# Patient Record
Sex: Female | Born: 1970
Health system: Southern US, Community
[De-identification: ages and names within clinical notes are randomized; demographics above are authoritative.]

## PROBLEM LIST (undated history)

## (undated) DIAGNOSIS — T7840XA Allergy, unspecified, initial encounter: Secondary | ICD-10-CM

## (undated) DIAGNOSIS — E282 Polycystic ovarian syndrome: Secondary | ICD-10-CM

## (undated) DIAGNOSIS — Z8632 Personal history of gestational diabetes: Secondary | ICD-10-CM

## (undated) DIAGNOSIS — R000 Tachycardia, unspecified: Secondary | ICD-10-CM

## (undated) DIAGNOSIS — O139 Gestational [pregnancy-induced] hypertension without significant proteinuria, unspecified trimester: Secondary | ICD-10-CM

## (undated) DIAGNOSIS — F32A Depression, unspecified: Secondary | ICD-10-CM

## (undated) DIAGNOSIS — I10 Essential (primary) hypertension: Secondary | ICD-10-CM

## (undated) DIAGNOSIS — F329 Major depressive disorder, single episode, unspecified: Secondary | ICD-10-CM

## (undated) HISTORY — PX: CARPAL TUNNEL RELEASE: SHX101

## (undated) HISTORY — PX: WISDOM TOOTH EXTRACTION: SHX21

## (undated) HISTORY — DX: Essential (primary) hypertension: I10

## (undated) HISTORY — DX: Allergy, unspecified, initial encounter: T78.40XA

## (undated) HISTORY — DX: Personal history of gestational diabetes: Z86.32

## (undated) HISTORY — PX: OTHER SURGICAL HISTORY: SHX169

## (undated) HISTORY — DX: Major depressive disorder, single episode, unspecified: F32.9

## (undated) HISTORY — DX: Polycystic ovarian syndrome: E28.2

## (undated) HISTORY — DX: Tachycardia, unspecified: R00.0

## (undated) HISTORY — DX: Depression, unspecified: F32.A

---

## 2007-05-11 ENCOUNTER — Encounter (INDEPENDENT_AMBULATORY_CARE_PROVIDER_SITE_OTHER): Payer: Self-pay | Admitting: *Deleted

## 2007-05-11 ENCOUNTER — Ambulatory Visit (HOSPITAL_COMMUNITY): Admission: RE | Admit: 2007-05-11 | Discharge: 2007-05-11 | Payer: Self-pay | Admitting: *Deleted

## 2008-08-05 ENCOUNTER — Ambulatory Visit: Payer: Self-pay | Admitting: Family Medicine

## 2008-08-05 DIAGNOSIS — I10 Essential (primary) hypertension: Secondary | ICD-10-CM | POA: Insufficient documentation

## 2008-08-07 LAB — CONVERTED CEMR LAB
BUN: 11 mg/dL (ref 6–23)
CO2: 28 meq/L (ref 19–32)
Calcium: 9.3 mg/dL (ref 8.4–10.5)
Chloride: 106 meq/L (ref 96–112)
Creatinine, Ser: 0.9 mg/dL (ref 0.4–1.2)
GFR calc non Af Amer: 75.52 mL/min (ref >60)
Glucose, Bld: 98 mg/dL (ref 70–99)
Potassium: 4.2 meq/L (ref 3.5–5.1)
Sodium: 142 meq/L (ref 135–145)
TSH: 1.88 microintl units/mL (ref 0.35–5.50)

## 2008-08-12 ENCOUNTER — Telehealth (INDEPENDENT_AMBULATORY_CARE_PROVIDER_SITE_OTHER): Payer: Self-pay | Admitting: *Deleted

## 2008-10-27 ENCOUNTER — Ambulatory Visit: Payer: Self-pay | Admitting: Family Medicine

## 2008-10-27 DIAGNOSIS — R519 Headache, unspecified: Secondary | ICD-10-CM | POA: Insufficient documentation

## 2008-10-27 DIAGNOSIS — R51 Headache: Secondary | ICD-10-CM | POA: Insufficient documentation

## 2008-10-28 ENCOUNTER — Inpatient Hospital Stay (HOSPITAL_COMMUNITY): Admission: AD | Admit: 2008-10-28 | Discharge: 2008-10-29 | Payer: Self-pay | Admitting: Obstetrics and Gynecology

## 2010-03-16 NOTE — Progress Notes (Signed)
Summary: METHYLDOPA RX CONCERNS//TABORI  Phone Note Call from Patient Call back at Home Phone [phone]   Caller: Patient Summary of Call: PT CALLED  WANTED TO DISCUSS MED METHYLDOPA THAT WAS PRESCRIBED TO HER AT OV. SAYS DISCUSSED TRYING TO GET PREGNANT, AND AFTER READING PAMPHLET SIDE EFFECTS OF THIS MED IS  HARD TO GET PREGNANT,  AND MISSING PERIODS.  AFTER TAKING THIS MED FOR 1 DAY MY  PERIOD STOPPED. Initial call taken by: Kandice Hams,  August 12, 2008 10:28 AM  Follow-up for Phone Call        will switch medication to Labetalol 100mg  two times a day, #60, 1 refill.  should have no interaction w/ periods or ovulation. Follow-up by: Neena Rhymes MD,  August 12, 2008 10:33 AM  Additional Follow-up for Phone Call Additional follow up Details #1::        left message on machine ....................Marland KitchenDoristine Devoid  August 12, 2008 11:17 AM   spoke with patient aware of change in med.................Marland KitchenDoristine Devoid  August 12, 2008 11:29 AM     New/Updated Medications: LABETALOL HCL 100 MG TABS (LABETALOL HCL) take one tablet two times a day   Prescriptions: LABETALOL HCL 100 MG TABS (LABETALOL HCL) take one tablet two times a day  #60 x 1   Entered by:   Doristine Devoid   Authorized by:   Neena Rhymes MD   Signed by:   Doristine Devoid on 08/12/2008   Method used:   [facility]        [facility]  [place] [phone]       [address] [place], [place]  [address]       Ph: [phone] or [phone]       Fax: [phone]   RxID:   [id number]

## 2010-03-16 NOTE — Assessment & Plan Note (Signed)
Summary: NEW/BCBS/NS/KDC   Vital Signs:  Patient profile:   40 year old female Height:      65.75 inches Weight:      234 pounds BMI:     38.19 Pulse rate:   72 / minute BP sitting:   142 / 100  (left arm)  Vitals Entered By: Doristine Devoid (August 05, 2008 8:23 AM) CC: NEW EST- ELEVATED BP   History of Present Illness: 39 yo woman here today at request of Dr Algie Coffer (GYN) for BP.  in the last yr, 3 of 4 BP checks have been elevated.  usually running 130-140/80-90.  strong family hx of HTN, early CAD, and DM.  pt had cholesterol and all lab work was done last month for insurance physical.  no CP, SOB, HAs, visual changes, edema.  has recently lost 30 lbs.  trying to get pregnant- not on birth control.  Preventive Screening-Counseling & Management  Alcohol-Tobacco     Alcohol drinks/day: 0     Smoking Status: never  Caffeine-Diet-Exercise     Does Patient Exercise: yes     Type of exercise: walking     Exercise (avg: min/session): 30-60     Times/week: 7      Sexual History:  currently monogamous.        Drug Use:  never.    Allergies (verified): 1)  ! Codeine  Past History:  Past Medical History: Hypertension  Past Surgical History: Polyp on uterus   Past History:  Care Management: Gynecology: Dr. Ernestina Penna   Family History: CAD-mother MI age 51 HTN-2 brothers DM-mother, 3 siblings STROKE-paternal grandfather COLON CA-no BREAST CA-no  Social History: lives w/ husband, cat works as Agricultural engineer- Engineer, building services Status:  never Does Patient Exercise:  yes Sexual History:  currently monogamous Drug Use:  never  Review of Systems      See HPI  Physical Exam  General:  Well-developed,well-nourished,in no acute distress; alert,appropriate and cooperative throughout examination Head:  Normocephalic and atraumatic without obvious abnormalities. No apparent alopecia or balding. Neck:  No deformities, masses, or tenderness noted. Lungs:  Normal  respiratory effort, chest expands symmetrically. Lungs are clear to auscultation, no crackles or wheezes. Heart:  Normal rate and regular rhythm. S1 and S2 normal without gallop, murmur, click, rub or other extra sounds. Pulses:  +2 carotid, radial, DP Extremities:  No clubbing, cyanosis, edema, or deformity noted with normal full range of motion of all joints.     Impression & Recommendations:  Problem # 1:  HYPERTENSION (ICD-401.9) Assessment New Pt's BP has been consistently elevated recently.  start Aldomet due to desire to get pregnant.  considered HCTZ which is category B in pregnancy if not pregnancy induced HTN, which it's then category D.  due to the large discrepancy in class rankings will not do HCTZ.  will check TSH to r/o underlying cause.  pt aware and in agreement w/ plan. The following medications were removed from the medication list:    Hydrochlorothiazide 12.5 Mg Tabs (Hydrochlorothiazide) .Marland Kitchen... Take 1 tab  by mouth every morning Her updated medication list for this problem includes:    Methyldopa 250 Mg Tabs (Methyldopa) .Marland Kitchen... 1 tab by mouth two times a day  Orders: Venipuncture ([address]) TLB-BMP (Basic Metabolic Panel-BMET) (80048-METABOL) TLB-TSH (Thyroid Stimulating Hormone) (84443-TSH)  Complete Medication List: 1)  Methyldopa 250 Mg Tabs (Methyldopa) .Marland Kitchen.. 1 tab by mouth two times a day  Patient Instructions: 1)  Please schedule a follow-up appointment in 1 month to recheck  BP. 2)  This medication is safe in pregnancy so do not worry if it would happen 3)  We'll notify you of your lab tests 4)  Call with any questions or concerns 5)  Keep up the good work on diet and exercise- you're already doing the most important stuff! 6)  Welcome!  We're glad to have you!  Prescriptions: METHYLDOPA 250 MG  TABS (METHYLDOPA) 1 tab by mouth two times a day  #60 x 3   Entered and Authorized by:   Neena Rhymes MD   Signed by:   Neena Rhymes MD on 08/05/2008   Method  used:   Print then Give to Patient   RxID:   [id number] HYDROCHLOROTHIAZIDE 12.5 MG  TABS (HYDROCHLOROTHIAZIDE) Take 1 tab  by mouth every morning  #30 x 3   Entered and Authorized by:   Neena Rhymes MD   Signed by:   Neena Rhymes MD on 08/05/2008   Method used:   Print then Give to Patient   RxID:   [id number]

## 2010-03-16 NOTE — Assessment & Plan Note (Signed)
Summary: ONE MTH FU/NS/ PT IS HAVING HEADACHES/KDC   Vital Signs:  Patient profile:   40 year old female Weight:      237.13 pounds Pulse rate:   84 / minute BP sitting:   120 / 70  Vitals Entered By: Kandice Hams (October 27, 2008 11:33 AM) CC: C/O HEADACHES F/U ON BP(PT JUST FOUND OUT SHE IS PREGNANT   History of Present Illness: 40 yo woman here today for f/u on BP.  found out last week is pregnant.  has been concerned due to increased HAs- was worried it was BP.  HA arrived on Saturday, still present.  HA encompasses whole head.  GYN- Fogelman aware she is on labetalol.  BP has been well controlled since starting meds.  pt aware that HAs can be hormonal but wants to be sure it's not BP related.  no CP, SOB, visual changes, N/V, focal weakness.    Current Medications (verified): 1)  Labetalol Hcl 100 Mg Tabs (Labetalol Hcl) .... Take One Tablet Two Times A Day  Allergies (verified): 1)  ! Codeine  Past History:  Past Medical History: Last updated: 08/05/2008 Hypertension  Review of Systems      See HPI  Physical Exam  General:  Well-developed,well-nourished,in no acute distress; alert,appropriate and cooperative throughout examination Head:  Normocephalic and atraumatic without obvious abnormalities. No apparent alopecia or balding. Eyes:  No corneal or conjunctival inflammation noted. EOMI. Perrla. Funduscopic exam benign, without hemorrhages, exudates or papilledema. Vision grossly normal. Lungs:  Normal respiratory effort, chest expands symmetrically. Lungs are clear to auscultation, no crackles or wheezes. Heart:  Normal rate and regular rhythm. S1 and S2 normal without gallop, murmur, click, rub or other extra sounds. Neurologic:  No cranial nerve deficits noted. Station and gait are normal. Plantar reflexes are down-going bilaterally. DTRs are symmetrical throughout. Sensory, motor and coordinative functions appear intact.   Impression &  Recommendations:  Problem # 1:  HEADACHE (ICD-784.0) Assessment New pt's BP excellently controlled.  no red flags on hx or PE.  likely due to hormonal changes caused by pregnance.  reviewed supportive care and red flags that should prompt return.  Pt expresses understanding and is in agreement w/ this plan. Her updated medication list for this problem includes:    Labetalol Hcl 100 Mg Tabs (Labetalol hcl) .Marland Kitchen... Take one tablet two times a day  Problem # 2:  HYPERTENSION (ICD-401.9) Assessment: Improved BP well controlled.  no changes at this time. Her updated medication list for this problem includes:    Labetalol Hcl 100 Mg Tabs (Labetalol hcl) .Marland Kitchen... Take one tablet two times a day  Complete Medication List: 1)  Labetalol Hcl 100 Mg Tabs (Labetalol hcl) .... Take one tablet two times a day  Patient Instructions: 1)  Your blood pressure looks great! 2)  I think your headaches are hormonal 3)  CONGRATS on being pregnant!!! Prescriptions: LABETALOL HCL 100 MG TABS (LABETALOL HCL) take one tablet two times a day  #60 x 3   Entered and Authorized by:   Neena Rhymes MD   Signed by:   Neena Rhymes MD on 10/27/2008   Method used:   [facility]        [phone]retail[facility]       [address]       [place], [place]  [address]       Ph: [phone] or [phone]       Fax: [phone]   RxID:   [id number]

## 2010-04-07 ENCOUNTER — Ambulatory Visit (INDEPENDENT_AMBULATORY_CARE_PROVIDER_SITE_OTHER): Payer: BC Managed Care – PPO | Admitting: Family Medicine

## 2010-04-07 ENCOUNTER — Encounter: Payer: Self-pay | Admitting: Family Medicine

## 2010-04-07 DIAGNOSIS — L02219 Cutaneous abscess of trunk, unspecified: Secondary | ICD-10-CM

## 2010-04-07 DIAGNOSIS — L03319 Cellulitis of trunk, unspecified: Secondary | ICD-10-CM | POA: Insufficient documentation

## 2010-04-13 NOTE — Assessment & Plan Note (Signed)
Summary: ER FOLLOW UP--PH   Vital Signs:  Patient profile:   40 year old female Weight:      246 pounds BMI:     40.15 Pulse rate:   82 / minute BP sitting:   130 / 84  (left arm)  Vitals Entered By: Doristine Devoid CMA (April 07, 2010 2:59 PM) CC: ER f/u for cellulitis given doxy w/ nausea only has 2 days left of med   History of Present Illness: 40 yo woman here today for ER f/u.  dx'd w/ cellulitis of trunk under R arm.  last week had 4 hr car ride and bra was irritating area.  'it felt like a tooth ache'.  went to ER b/c sister told her it didn't look good'.  area rapidly enlarged, 'like an oblong knot'.  was given demerol- made her sick.  started on Doxy- making pt very nauseous.  has 2 days remaining- has already taken for 8 days.  area is much improved per pt report and no longer painful.  Current Medications (verified): 1)  Labetalol Hcl 100 Mg Tabs (Labetalol Hcl) .... Take One Tablet Two Times A Day 2)  Cephalexin 500 Mg  Tabs (Cephalexin) .... Take One By Mouth 2 Times Daily.  Take W/ Food.  Allergies (verified): 1)  ! Codeine  Review of Systems      See HPI  Physical Exam  General:  Well-developed,well-nourished,in no acute distress; alert,appropriate and cooperative throughout examination Skin:  3 cm area along R middle ribs in midaxillary line w/ induration, no erythema, no drainage, no TTP Cervical Nodes:  No lymphadenopathy noted Axillary Nodes:  No palpable lymphadenopathy   Impression & Recommendations:  Problem # 1:  CELLULITIS/ABSCESS, TRUNK (ICD-682.2) Assessment New area healing well, no drainage, no erythema.  remaining induration.  given nausea will stop Doxy.  script given for Keflex should area again worsen.  reviewed supportive care and red flags that should prompt return.  Pt expresses understanding and is in agreement w/ this plan. Her updated medication list for this problem includes:    Cephalexin 500 Mg Tabs (Cephalexin) .Marland Kitchen... Take one by  mouth 2 times daily.  take w/ food.  Complete Medication List: 1)  Labetalol Hcl 100 Mg Tabs (Labetalol hcl) .... Take one tablet two times a day 2)  Cephalexin 500 Mg Tabs (Cephalexin) .... Take one by mouth 2 times daily.  take w/ food.  Patient Instructions: 1)  STOP the Doxycycline 2)  If the area has worsening redness, increased swelling or pain, or any other concerns- start the Cephalexin (take w/ food) 3)  Apply hot compresses to the area to encourage drainage 4)  Call with any questions or concerns 5)  Hang in there!!! Prescriptions: CEPHALEXIN 500 MG  TABS (CEPHALEXIN) take one by mouth 2 times daily.  take w/ food.  #14 x 0   Entered and Authorized by:   Neena Rhymes MD   Signed by:   Neena Rhymes MD on 04/07/2010   Method used:   Print then Give to Patient   RxID:   [id number]    Orders Added: 1)  Est. Patient Level III [38756]

## 2010-05-21 LAB — CBC
HCT: 32.5 % — ABNORMAL LOW (ref 36.0–46.0)
HCT: 35.3 % — ABNORMAL LOW (ref 36.0–46.0)
Hemoglobin: 10.7 g/dL — ABNORMAL LOW (ref 12.0–15.0)
Hemoglobin: 11.8 g/dL — ABNORMAL LOW (ref 12.0–15.0)
MCHC: 32.8 g/dL (ref 30.0–36.0)
MCHC: 33.3 g/dL (ref 30.0–36.0)
MCV: 84.3 fL (ref 78.0–100.0)
MCV: 85.7 fL (ref 78.0–100.0)
Platelets: 295 10*3/uL (ref 150–400)
Platelets: 351 10*3/uL (ref 150–400)
RBC: 3.8 MIL/uL — ABNORMAL LOW (ref 3.87–5.11)
RBC: 4.19 MIL/uL (ref 3.87–5.11)
RDW: 16.2 % — ABNORMAL HIGH (ref 11.5–15.5)
RDW: 16.5 % — ABNORMAL HIGH (ref 11.5–15.5)
WBC: 7.2 10*3/uL (ref 4.0–10.5)
WBC: 8 10*3/uL (ref 4.0–10.5)

## 2010-05-21 LAB — HCG, QUANTITATIVE, PREGNANCY
hCG, Beta Chain, Quant, S: 562 m[IU]/mL — ABNORMAL HIGH (ref <5)
hCG, Beta Chain, Quant, S: 745 m[IU]/mL — ABNORMAL HIGH (ref <5)

## 2010-06-29 NOTE — Op Note (Signed)
NAME:  Bowring, Flossie                  ACCOUNT NO.:  [id number]   MEDICAL RECORD NO.:  [id number]          PATIENT TYPE:  AMB   LOCATION:  SDC                           FACILITY:  [facility]   PHYSICIAN:  [place] B. Earlene Plater, M.D.  DATE OF BIRTH:  [birth year]   DATE OF PROCEDURE:  05/11/2007  DATE OF DISCHARGE:                               OPERATIVE REPORT   PREOPERATIVE DIAGNOSIS:  Abnormal bleeding, endometrial polyp.   POSTOPERATIVE DIAGNOSIS:  Abnormal bleeding, endometrial polyp.   PROCEDURE:  Hysteroscopy with D and C and polyp removal.   SURGEON:  [place] B. Earlene Plater, M.D.   ANESTHESIA:  Is MAC and 20 mL of 1% Nesacaine.   FINDINGS:  Two endometrial polyps, one at the right fundal area, one at  the level of the lower uterine segment.   SPECIMENS:  Endometrial polyps and endometrial curettings to pathology.   BLOOD LOSS:  Minimal.   COMPLICATIONS:  None.   FLUID DEFICIT:  Fifty mL.   INDICATIONS FOR PROCEDURE:  A patient with above history.  Saline  infusion ultrasound suggestive of endometrial mass.  The patient advised  the risks of surgery including infection, bleeding, uterine perforation,  damage to surrounding organs.   PROCEDURE IN DETAIL:  The patient was taken to the operating room and  MAC anesthesia obtained.  Prepped and draped in standard fashion.  Bladder drained with in-and-out cath.  Exam under anesthesia showed an  anteverted normal size uterus, no adnexal masses.   Speculum inserted.  Paracervical block placed.  Cervix dilated to #21.  The diagnostic hysteroscope was inserted.  Two polyps were seen as  outlined above.  Removed with Randall stone forceps and the endometrium  gently  curetted.  The cavity reinspected.  No additional findings noted.  Therefore the procedure was terminated.  The instruments were removed.  Cervix hemostatic.  The patient tolerated the procedure without  complication.  The patient was taken to the recovery room awake and in  stable  condition.      Gerri Spore B. Earlene Plater, M.D.  Electronically Signed     WBD/MEDQ  D:  05/11/2007  T:  05/12/2007  Job:  [id number]

## 2010-11-08 LAB — DIFFERENTIAL
Basophils Absolute: 0
Basophils Relative: 0
Eosinophils Absolute: 0.1
Eosinophils Relative: 1
Lymphocytes Relative: 31
Lymphs Abs: 2.1
Monocytes Absolute: 0.5
Monocytes Relative: 7
Neutro Abs: 4.2
Neutrophils Relative %: 61

## 2010-11-08 LAB — CBC
HCT: 41.9
Hemoglobin: 14
MCHC: 33.3
MCV: 82.1
Platelets: 424 — ABNORMAL HIGH
RBC: 5.11
RDW: 15.7 — ABNORMAL HIGH
WBC: 6.9

## 2010-11-08 LAB — PREGNANCY, URINE: Preg Test, Ur: NEGATIVE

## 2012-04-03 ENCOUNTER — Other Ambulatory Visit (HOSPITAL_COMMUNITY): Payer: Self-pay

## 2012-04-03 ENCOUNTER — Other Ambulatory Visit (HOSPITAL_COMMUNITY): Payer: Self-pay | Admitting: Obstetrics

## 2012-04-03 DIAGNOSIS — N979 Female infertility, unspecified: Secondary | ICD-10-CM

## 2012-04-10 ENCOUNTER — Ambulatory Visit (HOSPITAL_COMMUNITY)
Admission: RE | Admit: 2012-04-10 | Discharge: 2012-04-10 | Disposition: A | Discharge status: Home / Self Care | Payer: 59 | Source: Ambulatory Visit | Attending: Obstetrics | Admitting: Obstetrics

## 2012-04-10 DIAGNOSIS — N979 Female infertility, unspecified: Secondary | ICD-10-CM | POA: Insufficient documentation

## 2012-04-10 MED ORDER — IOHEXOL 300 MG/ML  SOLN
20.0000 mL | Freq: Once | INTRAMUSCULAR | Status: AC | PRN
  Administered 2012-04-10: 3 mL

## 2012-07-18 ENCOUNTER — Encounter: Payer: Self-pay | Admitting: Family Medicine

## 2012-07-18 ENCOUNTER — Ambulatory Visit (INDEPENDENT_AMBULATORY_CARE_PROVIDER_SITE_OTHER): Payer: 59 | Admitting: Family Medicine

## 2012-07-18 VITALS — BP 130/90 | HR 104 | Temp 99.3°F | Ht 66.0 in | Wt 252.0 lb

## 2012-07-18 DIAGNOSIS — I10 Essential (primary) hypertension: Secondary | ICD-10-CM

## 2012-07-18 MED ORDER — LABETALOL HCL 100 MG PO TABS: 100.0000 mg | ORAL_TABLET | Freq: Two times a day (BID) | ORAL | Status: DC

## 2012-07-18 NOTE — Progress Notes (Signed)
  Subjective:    Patient ID: Carol Houston, female    DOB: 03-Jun-1970, 42 y.o.   MRN: [id number]  HPI Elevated BP- pt has hx of similar.  Today's reading is the highest it's been.  Typically running 130s/80s.  No HAs, visual changes, CP, SOB, edema.  Previously on Labetalol w/ good results and no side effects.   Review of Systems For ROS see HPI     Objective:   Physical Exam  Vitals reviewed. Constitutional: She is oriented to person, place, and time. She appears well-developed and well-nourished. No distress.  HENT:  Head: Normocephalic and atraumatic.  Eyes: Conjunctivae and EOM are normal. Pupils are equal, round, and reactive to light.  Neck: Normal range of motion. Neck supple. No thyromegaly present.  Cardiovascular: Normal rate, regular rhythm, normal heart sounds and intact distal pulses.   No murmur heard. Pulmonary/Chest: Effort normal and breath sounds normal. No respiratory distress.  Abdominal: Soft. She exhibits no distension. There is no tenderness.  Musculoskeletal: She exhibits no edema.  Lymphadenopathy:    She has no cervical adenopathy.  Neurological: She is alert and oriented to person, place, and time.  Skin: Skin is warm and dry.  Psychiatric: She has a normal mood and affect. Her behavior is normal.          Assessment & Plan:

## 2012-07-18 NOTE — Patient Instructions (Addendum)
Follow up in 1 month to recheck BP Start the low dose labetalol twice daily Keep up the good work on weight loss efforts- you can do this! Call with any questions or concerns Hang in there!!!

## 2012-07-18 NOTE — Assessment & Plan Note (Signed)
Recurrent problem.  Directed related to pt's weight gain.  Discussed that pt is in grey area- elevated but not dramatically so.  Pt prefers to restart meds as she attempts to lose weight.  Will do Labetalol since she is attempting to get pregnant.  Pt expressed understanding and is in agreement w/ plan.

## 2012-11-08 ENCOUNTER — Ambulatory Visit (INDEPENDENT_AMBULATORY_CARE_PROVIDER_SITE_OTHER): Payer: BC Managed Care – PPO | Admitting: Family Medicine

## 2012-11-08 ENCOUNTER — Encounter: Payer: Self-pay | Admitting: Family Medicine

## 2012-11-08 VITALS — BP 124/82 | HR 108 | Temp 99.1°F | Wt 261.2 lb

## 2012-11-08 DIAGNOSIS — M25532 Pain in left wrist: Secondary | ICD-10-CM

## 2012-11-08 DIAGNOSIS — J069 Acute upper respiratory infection, unspecified: Secondary | ICD-10-CM

## 2012-11-08 DIAGNOSIS — I10 Essential (primary) hypertension: Secondary | ICD-10-CM

## 2012-11-08 DIAGNOSIS — M25539 Pain in unspecified wrist: Secondary | ICD-10-CM | POA: Insufficient documentation

## 2012-11-08 MED ORDER — NAPROXEN 500 MG PO TABS: 500.0000 mg | ORAL_TABLET | Freq: Two times a day (BID) | ORAL | Status: AC

## 2012-11-08 MED ORDER — PROMETHAZINE-DM 6.25-15 MG/5ML PO SYRP: 5.0000 mL | ORAL_SOLUTION | Freq: Four times a day (QID) | ORAL | Status: DC | PRN

## 2012-11-08 NOTE — Patient Instructions (Addendum)
Start the Naproxen twice daily- take w/ food- for pain and inflammation Wear the wrist brace for comfort ICE! Use the cough syrup as needed Drink plenty of fluids REST! Stop the Labetalol once feeling better- monitor your BP x2 weeks.  If consistently higher than 140/90, restart meds and call to report readings Call with any questions or concerns Hang in there!

## 2012-11-08 NOTE — Progress Notes (Signed)
  Subjective:    Patient ID: Carol Houston, female    DOB: 07/26/1970, 42 y.o.   MRN: [id number]  HPI Wrist pain- L, 'can't make a fist, hurts to pull my pants up'.  sxs started ~1 month ago.  Pain started in wrist and travelled into pinky.  Started tylenol arthritis w/out relief.  Now all fingers hurt to bend and pain is radiating up into elbow.  Does a lot of typing.  R hand dominant.  URI- sxs started Tuesday night w/ sore throat.  Now w/ nasal congestion, facial pain/pressure, HA.  No nausea.  Mild cough, + PND.  No ear pain.  Low grade fever.  + sick contacts.   Review of Systems For ROS see HPI     Objective:   Physical Exam  Vitals reviewed. Constitutional: She appears well-developed and well-nourished. No distress.  HENT:  Head: Normocephalic and atraumatic.  Right Ear: Tympanic membrane normal.  Left Ear: Tympanic membrane normal.  Nose: Mucosal edema and rhinorrhea present. Right sinus exhibits no maxillary sinus tenderness and no frontal sinus tenderness. Left sinus exhibits no maxillary sinus tenderness and no frontal sinus tenderness.  Mouth/Throat: Mucous membranes are normal. Posterior oropharyngeal erythema (w/ PND) present.  Eyes: Conjunctivae and EOM are normal. Pupils are equal, round, and reactive to light.  Neck: Normal range of motion. Neck supple.  Cardiovascular: Normal rate, regular rhythm and normal heart sounds.   Pulmonary/Chest: Effort normal and breath sounds normal. No respiratory distress. She has no wheezes. She has no rales.  Musculoskeletal:       Left wrist: She exhibits decreased range of motion (pain w/ extension, ulnar deviation), tenderness (over ulnar styloid) and swelling (diffusely of L hand and wrist). She exhibits no effusion and no deformity.  Lymphadenopathy:    She has no cervical adenopathy.          Assessment & Plan:

## 2012-11-11 NOTE — Assessment & Plan Note (Signed)
New.  Pain located on ulnar aspect of wrist.  Start scheduled NSAIDs.  Wear wrist brace to limit motion and allow healing of suspected tendonitis.  If no improvement, will need referral to ortho.  Reviewed supportive care and red flags that should prompt return.  Pt expressed understanding and is in agreement w/ plan.

## 2012-11-11 NOTE — Assessment & Plan Note (Signed)
New.  No evidence of bacterial infxn.  Cough syrup PRN.  Reviewed supportive care and red flags that should prompt return.  Pt expressed understanding and is in agreement w/ plan.

## 2012-11-11 NOTE — Assessment & Plan Note (Signed)
Improved.  Pt would like to stop Labetalol and see if BP remains controlled.  Reviewed how to stop meds, the importance of checking home BPs and calling in results.  Pt expressed understanding and is in agreement w/ plan.

## 2012-11-22 ENCOUNTER — Telehealth: Payer: Self-pay | Admitting: Family Medicine

## 2012-11-22 DIAGNOSIS — M25532 Pain in left wrist: Secondary | ICD-10-CM

## 2012-11-22 NOTE — Telephone Encounter (Signed)
Referral placed. Pt notified.  

## 2012-11-22 NOTE — Telephone Encounter (Signed)
Patient called to let dr Beverely Low know that her left hand and wrist is still not doing any better. Thanks

## 2014-05-19 LAB — OB RESULTS CONSOLE HEPATITIS B SURFACE ANTIGEN: Hepatitis B Surface Ag: NEGATIVE

## 2014-05-19 LAB — OB RESULTS CONSOLE HIV ANTIBODY (ROUTINE TESTING): HIV: NONREACTIVE

## 2014-05-19 LAB — OB RESULTS CONSOLE ABO/RH: RH Type: POSITIVE

## 2014-05-19 LAB — OB RESULTS CONSOLE RUBELLA ANTIBODY, IGM: Rubella: IMMUNE

## 2014-05-19 LAB — OB RESULTS CONSOLE ANTIBODY SCREEN: Antibody Screen: NEGATIVE

## 2014-05-19 LAB — OB RESULTS CONSOLE RPR: RPR: NONREACTIVE

## 2014-05-26 LAB — OB RESULTS CONSOLE GC/CHLAMYDIA
Chlamydia: NEGATIVE
Gonorrhea: NEGATIVE

## 2014-06-18 ENCOUNTER — Encounter: Payer: BLUE CROSS/BLUE SHIELD | Attending: Obstetrics

## 2014-06-18 VITALS — Ht 66.0 in | Wt 255.4 lb

## 2014-06-18 DIAGNOSIS — Z713 Dietary counseling and surveillance: Secondary | ICD-10-CM | POA: Diagnosis not present

## 2014-06-18 DIAGNOSIS — O24419 Gestational diabetes mellitus in pregnancy, unspecified control: Secondary | ICD-10-CM

## 2014-06-19 NOTE — Progress Notes (Signed)
  Patient was seen on 06/18/14 for Gestational Diabetes self-management . The following learning objectives were met by the patient :   States the definition of Gestational Diabetes  States why dietary management is important in controlling blood glucose  Describes the effects of carbohydrates on blood glucose levels  Demonstrates ability to create a balanced meal plan  Demonstrates carbohydrate counting   States when to check blood glucose levels  Demonstrates proper blood glucose monitoring techniques  States the effect of stress and exercise on blood glucose levels  States the importance of limiting caffeine and abstaining from alcohol and smoking  Plan:  Aim for 2 Carb Choices per meal (30 grams) +/- 1 either way for breakfast Aim for 3 Carb Choices per meal (45 grams) +/- 1 either way from lunch and dinner Aim for 1-2 Carbs per snack Begin reading food labels for Total Carbohydrate and sugar grams of foods Consider  increasing your activity level by walking daily as tolerated Begin checking BG before breakfast and 2 hours after first bit of breakfast, lunch and dinner after  as directed by MD  Take medication  as directed by MD  Blood glucose monitor given: One Touch Ultra Mini Lot # [id number] X Exp: 11/04/14 Blood glucose reading: 116m/dl  Patient instructed to monitor glucose levels: FBS: 60 - <90 2 hour: <120  Patient received the following handouts:  Nutrition Diabetes and Pregnancy  Carbohydrate Counting List  Meal Planning worksheet  Patient will be seen for follow-up as needed.

## 2014-11-10 LAB — OB RESULTS CONSOLE GBS: GBS: POSITIVE

## 2014-11-21 ENCOUNTER — Other Ambulatory Visit: Payer: Self-pay | Admitting: Obstetrics

## 2014-11-25 ENCOUNTER — Encounter (HOSPITAL_COMMUNITY): Payer: Self-pay | Admitting: *Deleted

## 2014-11-25 ENCOUNTER — Telehealth (HOSPITAL_COMMUNITY): Payer: Self-pay | Admitting: *Deleted

## 2014-11-25 NOTE — Telephone Encounter (Signed)
Preadmission screen  

## 2014-11-26 ENCOUNTER — Encounter (HOSPITAL_COMMUNITY): Payer: Self-pay | Admitting: *Deleted

## 2014-11-27 ENCOUNTER — Inpatient Hospital Stay (HOSPITAL_COMMUNITY): Payer: BLUE CROSS/BLUE SHIELD | Admitting: Anesthesiology

## 2014-11-27 ENCOUNTER — Inpatient Hospital Stay (HOSPITAL_COMMUNITY)
Admission: RE | Admit: 2014-11-27 | Discharge: 2014-12-01 | DRG: 765 | Disposition: A | Discharge status: Home / Self Care | Payer: BLUE CROSS/BLUE SHIELD | Source: Ambulatory Visit | Attending: Obstetrics | Admitting: Obstetrics

## 2014-11-27 ENCOUNTER — Encounter (HOSPITAL_COMMUNITY): Payer: Self-pay

## 2014-11-27 DIAGNOSIS — O24415 Gestational diabetes mellitus in pregnancy, controlled by oral hypoglycemic drugs: Secondary | ICD-10-CM | POA: Diagnosis present

## 2014-11-27 DIAGNOSIS — O324XX0 Maternal care for high head at term, not applicable or unspecified: Principal | ICD-10-CM | POA: Diagnosis present

## 2014-11-27 DIAGNOSIS — O10919 Unspecified pre-existing hypertension complicating pregnancy, unspecified trimester: Secondary | ICD-10-CM | POA: Diagnosis present

## 2014-11-27 DIAGNOSIS — Z6841 Body Mass Index (BMI) 40.0 and over, adult: Secondary | ICD-10-CM

## 2014-11-27 DIAGNOSIS — O99824 Streptococcus B carrier state complicating childbirth: Secondary | ICD-10-CM | POA: Diagnosis present

## 2014-11-27 DIAGNOSIS — O409XX0 Polyhydramnios, unspecified trimester, not applicable or unspecified: Secondary | ICD-10-CM | POA: Diagnosis present

## 2014-11-27 DIAGNOSIS — Z3A38 38 weeks gestation of pregnancy: Secondary | ICD-10-CM | POA: Diagnosis not present

## 2014-11-27 DIAGNOSIS — O403XX0 Polyhydramnios, third trimester, not applicable or unspecified: Secondary | ICD-10-CM | POA: Diagnosis present

## 2014-11-27 DIAGNOSIS — O99214 Obesity complicating childbirth: Secondary | ICD-10-CM | POA: Diagnosis present

## 2014-11-27 DIAGNOSIS — O1092 Unspecified pre-existing hypertension complicating childbirth: Secondary | ICD-10-CM | POA: Diagnosis present

## 2014-11-27 DIAGNOSIS — O10913 Unspecified pre-existing hypertension complicating pregnancy, third trimester: Secondary | ICD-10-CM | POA: Diagnosis present

## 2014-11-27 HISTORY — DX: Gestational (pregnancy-induced) hypertension without significant proteinuria, unspecified trimester: O13.9

## 2014-11-27 LAB — COMPREHENSIVE METABOLIC PANEL
ALT: 18 U/L (ref 14–54)
AST: 18 U/L (ref 15–41)
Albumin: 2.8 g/dL — ABNORMAL LOW (ref 3.5–5.0)
Alkaline Phosphatase: 176 U/L — ABNORMAL HIGH (ref 38–126)
Anion gap: 7 (ref 5–15)
BUN: 16 mg/dL (ref 6–20)
CO2: 17 mmol/L — ABNORMAL LOW (ref 22–32)
Calcium: 8.8 mg/dL — ABNORMAL LOW (ref 8.9–10.3)
Chloride: 109 mmol/L (ref 101–111)
Creatinine, Ser: 0.75 mg/dL (ref 0.44–1.00)
GFR calc Af Amer: >60 mL/min (ref >60)
GFR calc non Af Amer: >60 mL/min (ref >60)
Glucose, Bld: 96 mg/dL (ref 65–99)
Potassium: 3.6 mmol/L (ref 3.5–5.1)
Sodium: 133 mmol/L — ABNORMAL LOW (ref 135–145)
Total Bilirubin: 0.6 mg/dL (ref 0.3–1.2)
Total Protein: 6.5 g/dL (ref 6.5–8.1)

## 2014-11-27 LAB — CBC
HCT: 36.6 % (ref 36.0–46.0)
HCT: 36.6 % (ref 36.0–46.0)
Hemoglobin: 12 g/dL (ref 12.0–15.0)
Hemoglobin: 11.9 g/dL — ABNORMAL LOW (ref 12.0–15.0)
MCH: 29.9 pg (ref 26.0–34.0)
MCH: 29.6 pg (ref 26.0–34.0)
MCHC: 32.8 g/dL (ref 30.0–36.0)
MCHC: 32.5 g/dL (ref 30.0–36.0)
MCV: 91.3 fL (ref 78.0–100.0)
MCV: 91 fL (ref 78.0–100.0)
Platelets: 232 10*3/uL (ref 150–400)
Platelets: 236 10*3/uL (ref 150–400)
RBC: 4.01 MIL/uL (ref 3.87–5.11)
RBC: 4.02 MIL/uL (ref 3.87–5.11)
RDW: 15 % (ref 11.5–15.5)
RDW: 15.1 % (ref 11.5–15.5)
WBC: 6.9 10*3/uL (ref 4.0–10.5)
WBC: 9.2 10*3/uL (ref 4.0–10.5)

## 2014-11-27 LAB — TYPE AND SCREEN
ABO/RH(D): O POS
Antibody Screen: NEGATIVE

## 2014-11-27 LAB — GLUCOSE, CAPILLARY: Glucose-Capillary: 89 mg/dL (ref 65–99)

## 2014-11-27 LAB — URIC ACID: Uric Acid, Serum: 6.8 mg/dL — ABNORMAL HIGH (ref 2.3–6.6)

## 2014-11-27 LAB — ABO/RH: ABO/RH(D): O POS

## 2014-11-27 MED ORDER — LIDOCAINE HCL (PF) 1 % IJ SOLN
INTRAMUSCULAR | Status: DC | PRN
  Administered 2014-11-27 (×2): 4 mL

## 2014-11-27 MED ORDER — LACTATED RINGERS IV SOLN
500.0000 mL | INTRAVENOUS | Status: DC | PRN
  Administered 2014-11-27: 500 mL via INTRAVENOUS

## 2014-11-27 MED ORDER — OXYTOCIN BOLUS FROM INFUSION: 500.0000 mL | INTRAVENOUS | Status: DC

## 2014-11-27 MED ORDER — LABETALOL HCL 5 MG/ML IV SOLN
20.0000 mg | INTRAVENOUS | Status: DC | PRN
  Administered 2014-11-27: 20 mg via INTRAVENOUS

## 2014-11-27 MED ORDER — EPHEDRINE 5 MG/ML INJ: 10.0000 mg | INTRAVENOUS | Status: DC | PRN

## 2014-11-27 MED ORDER — FENTANYL 2.5 MCG/ML BUPIVACAINE 1/10 % EPIDURAL INFUSION (WH - ANES)
14.0000 mL/h | INTRAMUSCULAR | Status: DC | PRN
  Administered 2014-11-27 – 2014-11-28 (×2): 14 mL/h via EPIDURAL
  Filled 2014-11-27 (×2): qty 125

## 2014-11-27 MED ORDER — SODIUM BICARBONATE 8.4 % IV SOLN
INTRAVENOUS | Status: DC | PRN
  Administered 2014-11-27: 5 mL via EPIDURAL
  Administered 2014-11-28 (×2): 3 mL via EPIDURAL
  Administered 2014-11-28: 5 mL via EPIDURAL

## 2014-11-27 MED ORDER — FENTANYL CITRATE (PF) 100 MCG/2ML IJ SOLN
100.0000 ug | INTRAMUSCULAR | Status: DC | PRN
  Administered 2014-11-27: 100 ug via INTRAVENOUS
  Filled 2014-11-27: qty 2

## 2014-11-27 MED ORDER — LACTATED RINGERS IV SOLN
INTRAVENOUS | Status: DC
  Administered 2014-11-27 – 2014-11-28 (×2): via INTRAVENOUS

## 2014-11-27 MED ORDER — DIPHENHYDRAMINE HCL 50 MG/ML IJ SOLN: 12.5000 mg | INTRAMUSCULAR | Status: DC | PRN

## 2014-11-27 MED ORDER — ONDANSETRON HCL 4 MG/2ML IJ SOLN
4.0000 mg | Freq: Four times a day (QID) | INTRAMUSCULAR | Status: DC | PRN
Start: 2014-11-27 — End: 2014-11-28
  Administered 2014-11-27: 4 mg via INTRAVENOUS
  Filled 2014-11-27: qty 2

## 2014-11-27 MED ORDER — MISOPROSTOL 25 MCG QUARTER TABLET: 25.0000 ug | ORAL_TABLET | ORAL | Status: DC | PRN

## 2014-11-27 MED ORDER — PENICILLIN G POTASSIUM 5000000 UNITS IJ SOLR
5.0000 10*6.[IU] | Freq: Once | INTRAVENOUS | Status: AC
  Administered 2014-11-27: 5 10*6.[IU] via INTRAVENOUS
  Filled 2014-11-27: qty 5

## 2014-11-27 MED ORDER — ACETAMINOPHEN 325 MG PO TABS
650.0000 mg | ORAL_TABLET | ORAL | Status: DC | PRN
  Administered 2014-11-27 – 2014-11-28 (×2): 650 mg via ORAL
  Filled 2014-11-27 (×2): qty 2

## 2014-11-27 MED ORDER — HYDRALAZINE HCL 20 MG/ML IJ SOLN: 10.0000 mg | Freq: Once | INTRAMUSCULAR | Status: DC | PRN

## 2014-11-27 MED ORDER — OXYTOCIN 40 UNITS IN LACTATED RINGERS INFUSION - SIMPLE MED
1.0000 m[IU]/min | INTRAVENOUS | Status: DC
  Administered 2014-11-27: 2 m[IU]/min via INTRAVENOUS
  Filled 2014-11-27: qty 1000

## 2014-11-27 MED ORDER — LACTATED RINGERS IV SOLN
INTRAVENOUS | Status: DC
  Administered 2014-11-27 – 2014-11-28 (×4): via INTRAVENOUS

## 2014-11-27 MED ORDER — PENICILLIN G POTASSIUM 5000000 UNITS IJ SOLR
2.5000 10*6.[IU] | INTRAVENOUS | Status: DC
  Administered 2014-11-27 – 2014-11-28 (×5): 2.5 10*6.[IU] via INTRAVENOUS
  Filled 2014-11-27 (×8): qty 2.5

## 2014-11-27 MED ORDER — TERBUTALINE SULFATE 1 MG/ML IJ SOLN: 0.2500 mg | Freq: Once | INTRAMUSCULAR | Status: DC | PRN

## 2014-11-27 MED ORDER — PHENYLEPHRINE 40 MCG/ML (10ML) SYRINGE FOR IV PUSH (FOR BLOOD PRESSURE SUPPORT)
80.0000 ug | PREFILLED_SYRINGE | INTRAVENOUS | Status: DC | PRN
  Filled 2014-11-27: qty 20

## 2014-11-27 MED ORDER — LABETALOL HCL 5 MG/ML IV SOLN
INTRAVENOUS | Status: AC
  Filled 2014-11-27: qty 4

## 2014-11-27 MED ORDER — OXYTOCIN 40 UNITS IN LACTATED RINGERS INFUSION - SIMPLE MED: 62.5000 mL/h | INTRAVENOUS | Status: DC

## 2014-11-27 MED ORDER — LIDOCAINE HCL (PF) 1 % IJ SOLN
30.0000 mL | INTRAMUSCULAR | Status: DC | PRN
  Filled 2014-11-27: qty 30

## 2014-11-27 MED ORDER — CITRIC ACID-SODIUM CITRATE 334-500 MG/5ML PO SOLN
30.0000 mL | ORAL | Status: DC | PRN
  Administered 2014-11-28: 30 mL via ORAL
  Filled 2014-11-27: qty 15

## 2014-11-27 NOTE — Progress Notes (Signed)
Called and notified of pt 78min FHR deceleration. Also notified that we are attempting to gain IV access. Dr ordered fluids at 125/hr and continued monitoring. Will call back if FHR status changes again.

## 2014-11-27 NOTE — Progress Notes (Signed)
S: Doing well, no complaints, pain finally controlled with 2nd epidural  O: BP 157/78 mmHg  Pulse 80  Temp(Src) 98.1 F (36.7 C) (Oral)  Resp 18  Ht 5\' 6"  (1.676 m)  Wt 118.389 kg (261 lb)  BMI 42.15 kg/m2  SpO2 98%  LMP 02/28/2014   FHT:  FHR: 140s bpm, variability: moderate,  accelerations:  Present,  decelerations:  Present 6 min decel to 90s, unclear if tracing maternal or fetal, positon change, O2 and FSE/ IUPC placed UC:   3 SVE:   Dilation: 2 Effacement (%): 70 Station: -3 Exam by:: Pamala Hurry, MD   A / P:  44 y.o.  Obstetric History   G2   P0   T0   P0   A1   TAB0   SAB1   E0   M0   L0    at [redacted]w[redacted]d IOL, poly, chronic htn, GDM  Start BS q 2 hrs  Fetal Wellbeing:  Category I Pain Control:  Epidural  Anticipated MOD:  unclear    Carol Houston A. 11/27/2014, 9:20 PM

## 2014-11-27 NOTE — Progress Notes (Signed)
S: Doing well, no complaints, pain well controlled planning epidural when more active. No further HA  O: BP 134/72 mmHg  Pulse 76  Temp(Src) 98.4 F (36.9 C) (Oral)  Resp 18  Ht 5\' 6"  (1.676 m)  Wt 118.389 kg (261 lb)  BMI 42.15 kg/m2  SpO2 98%  LMP 02/28/2014   FHT:  FHR: 150s bpm, variability: moderate,  accelerations:  Present,  decelerations:  Absent UC:   regular, every 2-3 minutes, pit at 20 munits/ min SVE:   Dilation: 2 Effacement (%): 70 Station: -3 Exam by:: Pamala Hurry, MD  AROM- meconium   A / P:  44 y.o.  Obstetric History   G2   P0   T0   P0   A1   TAB0   SAB1   E0   M0   L0    at [redacted]w[redacted]d IOL chronic htn, GDM, AMA, polyhydramnios  Fetal Wellbeing:  Category I Pain Control:  Labor support without medications  Anticipated MOD:  NSVD  Tatijana Bierly A. 11/27/2014, 5:59 PM

## 2014-11-27 NOTE — Anesthesia Preprocedure Evaluation (Addendum)
Anesthesia Evaluation  Patient identified by MRN, date of birth, ID band Patient awake    Reviewed: Allergy & Precautions, NPO status , Patient's Chart, lab work & pertinent test results  History of Anesthesia Complications Negative for: history of anesthetic complications  Airway Mallampati: II  TM Distance: >3 FB Neck ROM: Full    Dental no notable dental hx. (+) Dental Advisory Given   Pulmonary neg pulmonary ROS,    Pulmonary exam normal breath sounds clear to auscultation       Cardiovascular hypertension, Pt. on medications Normal cardiovascular exam Rhythm:Regular Rate:Normal     Neuro/Psych  Headaches, PSYCHIATRIC DISORDERS Depression    GI/Hepatic negative GI ROS, Neg liver ROS,   Endo/Other  diabetes, Gestational, Oral Hypoglycemic AgentsMorbid obesity  Renal/GU negative Renal ROS  negative genitourinary   Musculoskeletal negative musculoskeletal ROS (+)   Abdominal   Peds negative pediatric ROS (+)  Hematology negative hematology ROS (+)   Anesthesia Other Findings   Reproductive/Obstetrics (+) Pregnancy                            Anesthesia Physical Anesthesia Plan  ASA: III  Anesthesia Plan: Epidural   Post-op Pain Management:    Induction:   Airway Management Planned:   Additional Equipment:   Intra-op Plan:   Post-operative Plan:   Informed Consent: I have reviewed the patients History and Physical, chart, labs and discussed the procedure including the risks, benefits and alternatives for the proposed anesthesia with the patient or authorized representative who has indicated his/her understanding and acceptance.   Dental advisory given  Plan Discussed with:   Anesthesia Plan Comments:        Anesthesia Quick Evaluation

## 2014-11-27 NOTE — MAU Note (Signed)
Pt presents for a scheduled induction but no rooms are available at the time. Will monitor baby and serial BP. No bleeding or leaking. Denies pain.

## 2014-11-27 NOTE — Progress Notes (Signed)
S: Doing well, no complaints, pain well controlled, not yet feeling contractions. Pt with mild HA, just took Tylenol, no scotomata, no RUQ pain, some GERD last night.  O: BP 134/89 mmHg  Pulse 79  Temp(Src) 98.4 F (36.9 C) (Oral)  Resp 16  Ht 5\' 6"  (1.676 m)  Wt 118.389 kg (261 lb)  BMI 42.15 kg/m2  SpO2 98%  LMP 02/28/2014 Filed Vitals:   11/27/14 1231 11/27/14 1300 11/27/14 1330 11/27/14 1400  BP: 137/78 140/68 138/84 134/89  Pulse: 78 78 82 79  Temp:    98.4 F (36.9 C)  TempSrc:    Oral  Resp: 16     Height:      Weight:      SpO2:       Gen: well appearing, no distress Abd: obese, NT, gravid, no RUQ pain LE: trace edema, 1+ DTR, no clonus  FHT:  FHR: 130s bpm, variability: moderate,  accelerations:  Present,  decelerations:  Absent UC:   regular, every 2-4 minutes SVE:   Dilation: 2 Effacement (%): Thick Station: Ballotable Exam by:: Dr Pamala Hurry   A / P:  44 y.o.  Obstetric History   G2   P0   T0   P0   A1   TAB0   SAB1   E0   M0   L0    at [redacted]w[redacted]d IOL for polyhydramnios, AMA, chronic htn, GDM. Cont to increase pitocin, AROM when able. No evidence PEC. Severe range bp this am likely due to stress, poor sleep, long wait for labor room. Bp improved with IV meds x 1, no need for Mag Sulfate at this time.   Fetal Wellbeing:  Category I Pain Control:  Labor support without medications  Anticipated MOD:  NSVD  Loyola Santino A. 11/27/2014, 2:45 PM

## 2014-11-27 NOTE — Progress Notes (Signed)
Called to notify of pt 62min deceleration. Dr. Garwin Brothers requested that I call Dr. Valentino Saxon since she is her primary physician and doing her induction

## 2014-11-27 NOTE — H&P (Signed)
Carol Houston is a 44 y.o. G2P0010 at [redacted]w[redacted]d presenting for IOL due to chronic htn, GDM and AMA. Pt notes rare contractions. Good fetal movement, No vaginal bleeding, not leaking fluid. No HA, no vision change, no RUQ pain  [facility] at [facility] since 6 wks - h/o infertility, failed OI/ IUI/ IVF then spontaneous pregnancy while "taking a break."  - Dated by 6 and 9 wk u/s - Obesity, no wt gain in preg,  - GDM vs T2DM. Diagnosed at 12 wks, but nl A1C, and pre-preg DM testing negative. Good control of BS on metfromin alone - Normal fetal echo - PCOS - AMA, normal Informaseq - chronic htn, on baby ASA and home bp checks,no bp meds needed - polyhydramnnios found at 38 wks- AFI 28 - fetal growth, 10/10, 38 wks, 8'3, 92% - GBS pos   Prenatal Transfer Tool  Maternal Diabetes: Yes:  Diabetes Type:  Insulin/Medication controlled Genetic Screening: Normal Maternal Ultrasounds/Referrals: Normal Fetal Ultrasounds or other Referrals:  Fetal echo Maternal Substance Abuse:  No Significant Maternal Medications:  None Significant Maternal Lab Results: None     OB History    Gravida Para Term Preterm AB TAB SAB Ectopic Multiple Living   2 0   1  1        Past Medical History  Diagnosis Date  . Hypertension   . Depression     after father died   . Pregnancy induced hypertension   . Gestational diabetes    Past Surgical History  Procedure Laterality Date  . Carpal tunnel release    . Uterine polyps removal     Family History: family history includes Asthma in her father; Cancer in her mother; Cerebral palsy in her brother; Diabetes in her brother and mother; Heart attack in her mother; Heart disease in her mother. Social History:  reports that she has never smoked. She has never used smokeless tobacco. She reports that she drinks alcohol. She reports that she does not use illicit drugs.  Review of Systems - Negative except discomfort of pregnancy     Blood pressure 145/95, pulse 93,  last menstrual period 02/28/2014, SpO2 98 %.  Physical Exam: Filed Vitals:   11/27/14 0554 11/27/14 0556 11/27/14 0600 11/27/14 0601  BP:      Pulse: 86 107 75 93  SpO2: 96% 96% 93% 98%    Prenatal labs: ABO, Rh: O/Positive/-- (04/04 0000) Antibody: Negative (04/04 0000) Rubella:  immune RPR: Nonreactive (04/04 0000)  HBsAg: Negative (04/04 0000)  HIV: Non-reactive (04/04 0000)  GBS: Positive (09/26 0000)  1 hr Glucola abnormal  Genetic screening nl INformaseq, nl AFP Anatomy US normal   Assessment/Plan: 44 y.o. G2P0010 at [redacted]w[redacted]d -Chronic htn, recc move to delivery dueto increased risks of developing PEC. No current sx of PEC, check labs on admit - GBS pos. PCN - AMA. Recc move to delivery given risks of placental compromise - GDM. Good control on metformin. Watch BS when in active labor - IOL. Plan cytotec then pitocin when more favorable.  Cecil Bixby A. 11/27/2014, 7:46 AM

## 2014-11-27 NOTE — Anesthesia Procedure Notes (Addendum)
Epidural Patient location during procedure: OB  Staffing Anesthesiologist: Manaia Samad Performed by: anesthesiologist   Preanesthetic Checklist Completed: patient identified, site marked, surgical consent, pre-op evaluation, timeout performed, IV checked, risks and benefits discussed and monitors and equipment checked  Epidural Patient position: sitting Prep: site prepped and draped and DuraPrep Patient monitoring: continuous pulse ox and blood pressure Approach: midline Location: L3-L4 Injection technique: LOR saline  Needle:  Needle type: Tuohy  Needle gauge: 17 G Needle length: 9 cm and 9 Needle insertion depth: 9 cm Catheter type: closed end flexible Catheter size: 19 Gauge Catheter at skin depth: 15 cm Test dose: negative  Assessment Events: blood not aspirated, injection not painful, no injection resistance, negative IV test and no paresthesia  Additional Notes Patient identified. Risks/Benefits/Options discussed with patient including but not limited to bleeding, infection, nerve damage, paralysis, failed block, incomplete pain control, headache, blood pressure changes, nausea, vomiting, reactions to medication both or allergic, itching and postpartum back pain. Confirmed with bedside nurse the patient's most recent platelet count. Confirmed with patient that they are not currently taking any anticoagulation, have any bleeding history or any family history of bleeding disorders. Patient expressed understanding and wished to proceed. All questions were answered. Sterile technique was used throughout the entire procedure. Please see nursing notes for vital signs. Test dose was given through epidural catheter and negative prior to continuing to dose epidural or start infusion. Warning signs of high block given to the patient including shortness of breath, tingling/numbness in hands, complete motor block, or any concerning symptoms with instructions to call for help. Patient was  given instructions on fall risk and not to get out of bed. All questions and concerns addressed with instructions to call with any issues or inadequate analgesia.     Epidural Patient location during procedure: OB  Staffing Anesthesiologist: Adasia Hoar Performed by: anesthesiologist   Preanesthetic Checklist Completed: patient identified, site marked, surgical consent, pre-op evaluation, timeout performed, IV checked, risks and benefits discussed and monitors and equipment checked  Epidural Patient position: sitting Prep: site prepped and draped and DuraPrep Patient monitoring: continuous pulse ox and blood pressure Approach: midline Location: L3-L4 Injection technique: LOR saline  Needle:  Needle type: Tuohy  Needle gauge: 17 G Needle length: 9 cm and 9 Needle insertion depth: 9 cm Catheter type: closed end flexible Catheter size: 19 Gauge Catheter at skin depth: 15 cm Test dose: negative  Assessment Events: blood not aspirated, injection not painful, no injection resistance, negative IV test and no paresthesia  Additional Notes Patient identified. Risks/Benefits/Options discussed with patient including but not limited to bleeding, infection, nerve damage, paralysis, failed block, incomplete pain control, headache, blood pressure changes, nausea, vomiting, reactions to medication both or allergic, itching and postpartum back pain. Confirmed with bedside nurse the patient's most recent platelet count. Confirmed with patient that they are not currently taking any anticoagulation, have any bleeding history or any family history of bleeding disorders. Patient expressed understanding and wished to proceed. All questions were answered. Sterile technique was used throughout the entire procedure. Please see nursing notes for vital signs. Test dose was given through epidural catheter and negative prior to continuing to dose epidural or start infusion. Warning signs of high block given to  the patient including shortness of breath, tingling/numbness in hands, complete motor block, or any concerning symptoms with instructions to call for help. Patient was given instructions on fall risk and not to get out of bed. All questions and concerns addressed with instructions  to call with any issues or inadequate analgesia.

## 2014-11-27 NOTE — Progress Notes (Signed)
Dr Pamala Hurry notified of patients admission status, FHR tracing, UC freq., maternal BPs, and POC. Orders initiated for hypertension order set

## 2014-11-28 ENCOUNTER — Encounter (HOSPITAL_COMMUNITY)
Admission: RE | Disposition: A | Discharge status: Home / Self Care | Payer: Self-pay | Source: Ambulatory Visit | Attending: Obstetrics

## 2014-11-28 ENCOUNTER — Encounter (HOSPITAL_COMMUNITY): Payer: Self-pay

## 2014-11-28 DIAGNOSIS — O409XX0 Polyhydramnios, unspecified trimester, not applicable or unspecified: Secondary | ICD-10-CM | POA: Diagnosis present

## 2014-11-28 LAB — GLUCOSE, CAPILLARY
Glucose-Capillary: 101 mg/dL — ABNORMAL HIGH (ref 65–99)
Glucose-Capillary: 105 mg/dL — ABNORMAL HIGH (ref 65–99)
Glucose-Capillary: 113 mg/dL — ABNORMAL HIGH (ref 65–99)
Glucose-Capillary: 127 mg/dL — ABNORMAL HIGH (ref 65–99)
Glucose-Capillary: 133 mg/dL — ABNORMAL HIGH (ref 65–99)

## 2014-11-28 LAB — RPR: RPR Ser Ql: NONREACTIVE

## 2014-11-28 SURGERY — Surgical Case
Anesthesia: Epidural

## 2014-11-28 MED ORDER — MEPERIDINE HCL 25 MG/ML IJ SOLN
INTRAMUSCULAR | Status: AC
  Filled 2014-11-28: qty 1

## 2014-11-28 MED ORDER — ERYTHROMYCIN 5 MG/GM OP OINT
TOPICAL_OINTMENT | OPHTHALMIC | Status: AC
  Filled 2014-11-28: qty 1

## 2014-11-28 MED ORDER — NALBUPHINE HCL 10 MG/ML IJ SOLN
5.0000 mg | INTRAMUSCULAR | Status: DC | PRN
  Administered 2014-11-28: 5 mg via INTRAVENOUS
  Filled 2014-11-28: qty 1

## 2014-11-28 MED ORDER — SCOPOLAMINE 1 MG/3DAYS TD PT72
MEDICATED_PATCH | TRANSDERMAL | Status: AC
Start: 2014-11-28 — End: 2014-11-28
  Filled 2014-11-28: qty 1

## 2014-11-28 MED ORDER — KETOROLAC TROMETHAMINE 30 MG/ML IJ SOLN
30.0000 mg | Freq: Once | INTRAMUSCULAR | Status: AC
  Administered 2014-11-28: 30 mg via INTRAMUSCULAR

## 2014-11-28 MED ORDER — WITCH HAZEL-GLYCERIN EX PADS: 1.0000 "application " | MEDICATED_PAD | CUTANEOUS | Status: DC | PRN

## 2014-11-28 MED ORDER — OXYTOCIN 10 UNIT/ML IJ SOLN
INTRAMUSCULAR | Status: AC
Start: 2014-11-28 — End: 2014-11-28
  Filled 2014-11-28: qty 4

## 2014-11-28 MED ORDER — IBUPROFEN 600 MG PO TABS
600.0000 mg | ORAL_TABLET | Freq: Four times a day (QID) | ORAL | Status: DC
  Administered 2014-11-29 (×3): 600 mg via ORAL
  Filled 2014-11-28 (×4): qty 1

## 2014-11-28 MED ORDER — ONDANSETRON HCL 4 MG/2ML IJ SOLN
INTRAMUSCULAR | Status: DC | PRN
Start: 2014-11-28 — End: 2014-11-28
  Administered 2014-11-28: 4 mg via INTRAVENOUS

## 2014-11-28 MED ORDER — OXYTOCIN 10 UNIT/ML IJ SOLN
40.0000 [IU] | INTRAMUSCULAR | Status: DC | PRN
  Administered 2014-11-28: 40 [IU] via INTRAVENOUS

## 2014-11-28 MED ORDER — ONDANSETRON HCL 4 MG/2ML IJ SOLN: 4.0000 mg | Freq: Four times a day (QID) | INTRAMUSCULAR | Status: DC | PRN

## 2014-11-28 MED ORDER — MENTHOL 3 MG MT LOZG: 1.0000 | LOZENGE | OROMUCOSAL | Status: DC | PRN

## 2014-11-28 MED ORDER — LANOLIN HYDROUS EX OINT: 1.0000 "application " | TOPICAL_OINTMENT | CUTANEOUS | Status: DC | PRN

## 2014-11-28 MED ORDER — MORPHINE SULFATE (PF) 0.5 MG/ML IJ SOLN
INTRAMUSCULAR | Status: AC
  Filled 2014-11-28: qty 100

## 2014-11-28 MED ORDER — DIBUCAINE 1 % RE OINT: 1.0000 "application " | TOPICAL_OINTMENT | RECTAL | Status: DC | PRN

## 2014-11-28 MED ORDER — PRENATAL MULTIVITAMIN CH
1.0000 | ORAL_TABLET | Freq: Every day | ORAL | Status: DC
  Administered 2014-11-29 – 2014-12-01 (×3): 1 via ORAL
  Filled 2014-11-28 (×3): qty 1

## 2014-11-28 MED ORDER — ZOLPIDEM TARTRATE 5 MG PO TABS: 5.0000 mg | ORAL_TABLET | Freq: Every evening | ORAL | Status: DC | PRN

## 2014-11-28 MED ORDER — ONDANSETRON HCL 4 MG/2ML IJ SOLN
INTRAMUSCULAR | Status: AC
  Filled 2014-11-28: qty 2

## 2014-11-28 MED ORDER — CHLOROPROCAINE HCL 3 % IJ SOLN
INTRAMUSCULAR | Status: AC
  Filled 2014-11-28: qty 20

## 2014-11-28 MED ORDER — MEPERIDINE HCL 25 MG/ML IJ SOLN
INTRAMUSCULAR | Status: DC | PRN
  Administered 2014-11-28 (×2): 12.5 mg via INTRAVENOUS

## 2014-11-28 MED ORDER — OXYTOCIN 40 UNITS IN LACTATED RINGERS INFUSION - SIMPLE MED: 62.5000 mL/h | INTRAVENOUS | Status: AC

## 2014-11-28 MED ORDER — LACTATED RINGERS IV SOLN
INTRAVENOUS | Status: DC | PRN
  Administered 2014-11-28 (×2): via INTRAVENOUS

## 2014-11-28 MED ORDER — VITAMIN K1 1 MG/0.5ML IJ SOLN
INTRAMUSCULAR | Status: AC
  Filled 2014-11-28: qty 0.5

## 2014-11-28 MED ORDER — SCOPOLAMINE 1 MG/3DAYS TD PT72
MEDICATED_PATCH | TRANSDERMAL | Status: DC | PRN
  Administered 2014-11-28: 1 via TRANSDERMAL

## 2014-11-28 MED ORDER — SENNOSIDES-DOCUSATE SODIUM 8.6-50 MG PO TABS
2.0000 | ORAL_TABLET | ORAL | Status: DC
  Administered 2014-11-29 – 2014-11-30 (×2): 2 via ORAL
  Filled 2014-11-28 (×3): qty 2

## 2014-11-28 MED ORDER — LIDOCAINE-EPINEPHRINE (PF) 2 %-1:200000 IJ SOLN
INTRAMUSCULAR | Status: AC
  Filled 2014-11-28: qty 20

## 2014-11-28 MED ORDER — ACETAMINOPHEN 325 MG PO TABS
650.0000 mg | ORAL_TABLET | ORAL | Status: DC | PRN
  Administered 2014-11-29 (×3): 650 mg via ORAL
  Filled 2014-11-28 (×3): qty 2

## 2014-11-28 MED ORDER — SIMETHICONE 80 MG PO CHEW
80.0000 mg | CHEWABLE_TABLET | ORAL | Status: DC
  Administered 2014-11-29 – 2014-11-30 (×2): 80 mg via ORAL
  Filled 2014-11-28 (×3): qty 1

## 2014-11-28 MED ORDER — LACTATED RINGERS IV SOLN
INTRAVENOUS | Status: DC
  Administered 2014-11-28 – 2014-11-29 (×2): via INTRAVENOUS

## 2014-11-28 MED ORDER — MORPHINE SULFATE (PF) 0.5 MG/ML IJ SOLN
INTRAMUSCULAR | Status: DC | PRN
  Administered 2014-11-28: 4 mg via EPIDURAL
  Administered 2014-11-28: 1 mg via INTRAVENOUS

## 2014-11-28 MED ORDER — METFORMIN HCL ER 750 MG PO TB24
750.0000 mg | ORAL_TABLET | Freq: Two times a day (BID) | ORAL | Status: DC
Start: 2014-11-28 — End: 2014-12-01
  Administered 2014-11-29 – 2014-12-01 (×5): 750 mg via ORAL
  Filled 2014-11-28 (×8): qty 1

## 2014-11-28 MED ORDER — SIMETHICONE 80 MG PO CHEW
80.0000 mg | CHEWABLE_TABLET | Freq: Three times a day (TID) | ORAL | Status: DC
  Administered 2014-11-29 – 2014-12-01 (×6): 80 mg via ORAL
  Filled 2014-11-28 (×6): qty 1

## 2014-11-28 MED ORDER — DIPHENHYDRAMINE HCL 25 MG PO CAPS
25.0000 mg | ORAL_CAPSULE | Freq: Four times a day (QID) | ORAL | Status: DC | PRN
  Administered 2014-11-28: 25 mg via ORAL
  Filled 2014-11-28: qty 1

## 2014-11-28 MED ORDER — KETOROLAC TROMETHAMINE 30 MG/ML IJ SOLN
INTRAMUSCULAR | Status: AC
  Filled 2014-11-28: qty 1

## 2014-11-28 MED ORDER — SODIUM BICARBONATE 8.4 % IV SOLN
INTRAVENOUS | Status: AC
Start: 2014-11-28 — End: 2014-11-28
  Filled 2014-11-28: qty 50

## 2014-11-28 MED ORDER — SIMETHICONE 80 MG PO CHEW: 80.0000 mg | CHEWABLE_TABLET | ORAL | Status: DC | PRN

## 2014-11-28 MED ORDER — DEXTROSE 5 % IV SOLN
3.0000 g | Freq: Once | INTRAVENOUS | Status: AC
  Administered 2014-11-28: 3 g via INTRAVENOUS
  Filled 2014-11-28: qty 3000

## 2014-11-28 MED ORDER — TETANUS-DIPHTH-ACELL PERTUSSIS 5-2.5-18.5 LF-MCG/0.5 IM SUSP: 0.5000 mL | Freq: Once | INTRAMUSCULAR | Status: DC

## 2014-11-28 SURGICAL SUPPLY — 33 items
CLAMP CORD UMBIL (MISCELLANEOUS) IMPLANT
CLOTH BEACON ORANGE TIMEOUT ST (SAFETY) ×2 IMPLANT
CONTAINER PREFILL 10% NBF 15ML (MISCELLANEOUS) IMPLANT
DRAPE SHEET LG 3/4 BI-LAMINATE (DRAPES) IMPLANT
DRSG OPSITE POSTOP 4X10 (GAUZE/BANDAGES/DRESSINGS) ×2 IMPLANT
DURAPREP 26ML APPLICATOR (WOUND CARE) ×2 IMPLANT
ELECT REM PT RETURN 9FT ADLT (ELECTROSURGICAL) ×2
ELECTRODE REM PT RTRN 9FT ADLT (ELECTROSURGICAL) ×1 IMPLANT
EXTRACTOR VACUUM M CUP 4 TUBE (SUCTIONS) IMPLANT
GLOVE BIO SURGEON STRL SZ 6.5 (GLOVE) ×2 IMPLANT
GLOVE BIOGEL PI IND STRL 7.0 (GLOVE) ×1 IMPLANT
GLOVE BIOGEL PI INDICATOR 7.0 (GLOVE) ×1
GOWN STRL REUS W/TWL LRG LVL3 (GOWN DISPOSABLE) ×4 IMPLANT
KIT ABG SYR 3ML LUER SLIP (SYRINGE) IMPLANT
LIQUID BAND (GAUZE/BANDAGES/DRESSINGS) ×2 IMPLANT
NEEDLE HYPO 22GX1.5 SAFETY (NEEDLE) IMPLANT
NEEDLE HYPO 25X5/8 SAFETYGLIDE (NEEDLE) IMPLANT
NS IRRIG 1000ML POUR BTL (IV SOLUTION) ×2 IMPLANT
PACK C SECTION WH (CUSTOM PROCEDURE TRAY) ×2 IMPLANT
PAD OB MATERNITY 4.3X12.25 (PERSONAL CARE ITEMS) ×2 IMPLANT
PENCIL SMOKE EVAC W/HOLSTER (ELECTROSURGICAL) ×2 IMPLANT
STRIP CLOSURE SKIN 1/2X4 (GAUZE/BANDAGES/DRESSINGS) IMPLANT
SUT MON AB 4-0 PS1 27 (SUTURE) ×2 IMPLANT
SUT PLAIN 0 NONE (SUTURE) IMPLANT
SUT PLAIN 2 0 XLH (SUTURE) ×2 IMPLANT
SUT VIC AB 0 CT1 36 (SUTURE) ×4 IMPLANT
SUT VIC AB 0 CTX 36 (SUTURE) ×3
SUT VIC AB 0 CTX36XBRD ANBCTRL (SUTURE) ×3 IMPLANT
SUT VIC AB 2-0 CT1 27 (SUTURE) ×1
SUT VIC AB 2-0 CT1 TAPERPNT 27 (SUTURE) ×1 IMPLANT
SYR CONTROL 10ML LL (SYRINGE) IMPLANT
TOWEL OR 17X24 6PK STRL BLUE (TOWEL DISPOSABLE) ×2 IMPLANT
TRAY FOLEY CATH SILVER 14FR (SET/KITS/TRAYS/PACK) IMPLANT

## 2014-11-28 NOTE — Anesthesia Postprocedure Evaluation (Signed)
Anesthesia Post Note  Patient: Carol Houston  Procedure(s) Performed: Procedure(s) (LRB): CESAREAN SECTION (N/A)  Anesthesia type: Epidural  Patient location: Mother/Baby  Post pain: Pain level controlled  Post assessment: Post-op Vital signs reviewed  Last Vitals:  Filed Vitals:   11/28/14 1300  BP: 139/82  Pulse: 79  Temp: 37.3 C  Resp: 18    Post vital signs: Reviewed  Level of consciousness:alert  Complications: No apparent anesthesia complications

## 2014-11-28 NOTE — Lactation Note (Signed)
This note was copied from the chart of [place]. Lactation Consultation Note  Patient Name: Boy Rochella Benner VELFY'B Date: 11/28/2014 Reason for consult: Initial assessment;Difficult latch RN requested latch help. Baby is 74hr old and has only fed well 1 time since birth. Baby spit up once and had mucus come out of his nose twice while trying to latch. Mom and FOB get upset when this happens and mom states that the baby is choking, baby does not appear to be choking. Mom can demonstrate manual expression, colostrum noted bilaterally. Mom will manually express and feed bach her milk by spoon. She will page for lactation help as needed. Mom will keep baby sts while she is awake. She is aware of O/P lactation services and support group.   Maternal Data Has patient been taught Hand Expression?: Yes Does the patient have breastfeeding experience prior to this delivery?: No  Feeding Feeding Type: Breast Fed Length of feed: 5 min  LATCH Score/Interventions Latch: Too sleepy or reluctant, no latch achieved, no sucking elicited. Intervention(s): Skin to skin Intervention(s): Adjust position;Assist with latch;Breast massage;Breast compression  Audible Swallowing: None Intervention(s): Hand expression  Type of Nipple: Everted at rest and after stimulation  Comfort (Breast/Nipple): Soft / non-tender     Hold (Positioning): Assistance needed to correctly position infant at breast and maintain latch. Intervention(s): Support Pillows;Position options  LATCH Score: 5  Lactation Tools Discussed/Used WIC Program: No   Consult Status Consult Status: Follow-up Date: 11/29/14 Follow-up type: In-patient    Denzil Hughes 11/28/2014, 5:52 PM

## 2014-11-28 NOTE — Transfer of Care (Signed)
Immediate Anesthesia Transfer of Care Note  Patient: Carol Houston   Procedure(s) Performed: Procedure(s): CESAREAN SECTION (N/A)  Patient Location: PACU  Anesthesia Type:Epidural  Level of Consciousness: awake, alert  and oriented  Airway & Oxygen Therapy: Patient Spontanous Breathing  Post-op Assessment: Report given to RN and Post -op Vital signs reviewed and stable  Post vital signs: Reviewed and stable  Last Vitals:  Filed Vitals:   11/28/14 0819  BP: 135/70  Pulse: 106  Temp:   Resp:     Complications: No apparent anesthesia complications

## 2014-11-28 NOTE — Op Note (Signed)
11/27/2014 - 11/28/2014  9:45 AM  PATIENT:  Carol Houston  44 y.o. female  PRE-OPERATIVE DIAGNOSIS:  Primary Cesarean Section for Arrest of Descent, polyhydramnios, chronic htn, GDM  POST-OPERATIVE DIAGNOSIS:  Arrest of Descent, polyhydramnios, chronic htn, GDM  PROCEDURE:  Procedure(s): CESAREAN SECTION (N/A) PCS, LTCS, 2 layer closure  SURGEON:  Surgeon(s) and Role:    * Aloha Gell, MD - Primary  PHYSICIAN ASSISTANT:   ASSISTANTS: Bhambri, CNM   ANESTHESIA:   local and epidural  EBL:  Total I/O In: 1700 [I.V.:1700] Out: 600 [Urine:100; Blood:500]  BLOOD ADMINISTERED:none  DRAINS: Urinary Catheter (Foley)   LOCAL MEDICATIONS USED:  OTHER 30 cc nesicaine placed into peritoneal cavity  SPECIMEN:  No Specimen  DISPOSITION OF SPECIMEN:  PATHOLOGY  COUNTS:  YES  TOURNIQUET:  * No tourniquets in log *  DICTATION: .Note written in EPIC  PLAN OF CARE: Admit to inpatient   PATIENT DISPOSITION:  PACU - hemodynamically stable.   Delay start of Pharmacological VTE agent (>24hrs) due to surgical blood loss or risk of bleeding: yes   Findings:  @BABYSEXEBC @ infant,  APGAR (1 MIN): 8   APGAR (5 MINS): 9   APGAR (10 MINS):   Normal uterus, tubes and ovaries, normal placenta. 3VC, clear amniotic fluid  EBL: 500 cc Antibiotics:  3g Ancef Complications: none  Indications: This is a 44 y.o. year-old, G1  At [redacted]w[redacted]d admitted for IOL due to chronic htn, GDM, polyhydramnioa, AMA. . Risks benefits and alternatives of the procedure were discussed with the patient who agreed to proceed. Pt has pitocin IOL, AROM when in active labor and slow but steady progress to full dilation. 1 hr passive descent, 1 hr pushing, another hour passive descent and another hour pushing with poor effort. No fetal descent past 0 to +1 station. Pt asking for PCS.  Procedure:  After informed consent was obtained the patient was taken to the operating room where epidural anesthesia was intiated.  She was  prepped and draped in the normal sterile fashion in dorsal supine position with a leftward tilt.  A foley catheter was in place.  A Pfannenstiel skin incision was made 2 cm above the pubic symphysis in the midline with the scalpel.  Dissection was carried down with the Bovie cautery until the fascia was reached. The fascia was incised in the midline. The incision was extended laterally with the Mayo scissors. The inferior aspect of the fascial incision was grasped with the Coker clamps, elevated up and the underlying rectus muscles were dissected off sharply. The superior aspect of the fascial incision was grasped with the Coker clamps elevated up and the underlying rectus muscles were dissected off sharply.  The peritoneum was entered bluntly. The peritoneal incision was extended superiorly and inferiorly with good visualization of the bladder. The bladder blade was inserted and palpation was done to assess the fetal position and the location of the uterine vessels. The lower segment of the uterus was incised sharply with the scalpel and extended  bluntly in the cephalo-caudal fashion. The infant was grasped, brought to the incision, nuchal x 1 reduced,  rotated and the infant was delivered with fundal pressure. The nose and mouth were bulb suctioned. The cord was clamped and cut. The infant was handed off to the waiting pediatrician. The placenta was expressed. The uterus was exteriorized. The uterus was cleared of all clots and debris. The uterine incision was repaired with 0 Vicryl in a running locked fashion.  A second layer of the  same suture was used in an imbricating fashion to obtain excellent hemostasis. 3 additional figure of 8 sutures needed for hemostasis. The uterus was then returned to the abdomen, the gutters were cleared of all clots and debris. The uterine incision was reinspected and found to be hemostatic. The peritoneum was grasped and closed with 2-0 Vicryl in a running fashion. The cut muscle  edges and the underside of the fascia were inspected and found to be hemostatic. The fascia was closed with 0 Vicryl in two halves . The subcutaneous tissue was irrigated. Scarpa's layer was closed with a 2-0 plain gut suture. The skin was closed with a 4-0 Monocryl in a single layer.  A negative pressure dressing was placed over the incision. The patient tolerated the procedure well. Sponge lap and needle counts were correct x3 and patient was taken to the recovery room in a stable condition.  Alvita Fana A. 11/28/2014 9:47 AM

## 2014-11-28 NOTE — Progress Notes (Signed)
Pt tired but overall comfortable with epidural  DOA, vtx 0 station, molding, caput, overriding sutures, no descent past 0 to +1 station despite 2 hrs pushing and 2 hrs passsive descent.  Plan PCS.   Neesa Knapik A. 11/28/2014 8:07 AM

## 2014-11-28 NOTE — Anesthesia Postprocedure Evaluation (Signed)
  Anesthesia Post-op Note  Patient: Carol Houston  Procedure(s) Performed: Procedure(s): CESAREAN SECTION (N/A)  Patient Location: PACU  Anesthesia Type:Spinal  Level of Consciousness: awake and alert   Airway and Oxygen Therapy: Patient Spontanous Breathing  Post-op Pain: mild  Post-op Assessment: Post-op Vital signs reviewed, Patient's Cardiovascular Status Stable, Respiratory Function Stable, Patent Airway and No signs of Nausea or vomiting              Post-op Vital Signs: Reviewed and stable  Last Vitals:  Filed Vitals:   11/28/14 0819  BP: 135/70  Pulse: 106  Temp:   Resp:     Complications: No apparent anesthesia complications

## 2014-11-28 NOTE — Brief Op Note (Signed)
11/27/2014 - 11/28/2014  9:45 AM  PATIENT:  Carol Houston  44 y.o. female  PRE-OPERATIVE DIAGNOSIS:  Primary Cesarean Section for Arrest of Descent, polyhydramnios, chronic htn, GDM  POST-OPERATIVE DIAGNOSIS:  Arrest of Descent, polyhydramnios, chronic htn, GDM  PROCEDURE:  Procedure(s): CESAREAN SECTION (N/A) PCS, LTCS, 2 layer closure  SURGEON:  Surgeon(s) and Role:    * Aloha Gell, MD - Primary  PHYSICIAN ASSISTANT:   ASSISTANTS: Bhambri, CNM   ANESTHESIA:   local and epidural  EBL:  Total I/O In: 1700 [I.V.:1700] Out: 600 [Urine:100; Blood:500]  BLOOD ADMINISTERED:none  DRAINS: Urinary Catheter (Foley)   LOCAL MEDICATIONS USED:  OTHER 30 cc nesicaine placed into peritoneal cavity  SPECIMEN:  No Specimen  DISPOSITION OF SPECIMEN:  PATHOLOGY  COUNTS:  YES  TOURNIQUET:  * No tourniquets in log *  DICTATION: .Note written in EPIC  PLAN OF CARE: Admit to inpatient   PATIENT DISPOSITION:  PACU - hemodynamically stable.   Delay start of Pharmacological VTE agent (>24hrs) due to surgical blood loss or risk of bleeding: yes

## 2014-11-28 NOTE — Addendum Note (Signed)
Addendum  created 11/28/14 1427 by Lenox Ponds, CRNA   Modules edited: Notes Section   Notes Section:  File: [id number]

## 2014-11-29 LAB — CBC
HCT: 33.2 % — ABNORMAL LOW (ref 36.0–46.0)
Hemoglobin: 10.7 g/dL — ABNORMAL LOW (ref 12.0–15.0)
MCH: 29.6 pg (ref 26.0–34.0)
MCHC: 32.2 g/dL (ref 30.0–36.0)
MCV: 92 fL (ref 78.0–100.0)
Platelets: 209 10*3/uL (ref 150–400)
RBC: 3.61 MIL/uL — ABNORMAL LOW (ref 3.87–5.11)
RDW: 15.5 % (ref 11.5–15.5)
WBC: 9.3 10*3/uL (ref 4.0–10.5)

## 2014-11-29 LAB — CCBB MATERNAL DONOR DRAW

## 2014-11-29 LAB — GLUCOSE, CAPILLARY: Glucose-Capillary: 88 mg/dL (ref 65–99)

## 2014-11-29 MED ORDER — IBUPROFEN 800 MG PO TABS
800.0000 mg | ORAL_TABLET | Freq: Three times a day (TID) | ORAL | Status: DC
  Administered 2014-11-29 – 2014-12-01 (×6): 800 mg via ORAL
  Filled 2014-11-29 (×6): qty 1

## 2014-11-29 MED ORDER — HYDROCODONE-ACETAMINOPHEN 5-325 MG PO TABS
1.0000 | ORAL_TABLET | Freq: Four times a day (QID) | ORAL | Status: DC | PRN
Start: 2014-11-29 — End: 2014-12-01
  Administered 2014-11-29: 1 via ORAL
  Administered 2014-11-30: 2 via ORAL
  Administered 2014-11-30 – 2014-12-01 (×5): 1 via ORAL
  Filled 2014-11-29 (×2): qty 1
  Filled 2014-11-29: qty 2
  Filled 2014-11-29 (×3): qty 1
  Filled 2014-11-29: qty 2

## 2014-11-29 NOTE — Lactation Note (Signed)
This note was copied from the chart of [place]. Lactation Consultation Note  Patient Name: Carol Houston SRPRX'Y Date: 11/29/2014 Reason for consult: Follow-up assessment;NICU baby Mom reports pumping a few times but not receiving more than 1-2 drops. Reviewed normal milk production parameters with Mom. Encouraged to pump every 3 hours for 15 minutes on preemie setting to encourage milk production followed by 5 minutes of hand expression. Breast milk storage guidelines reviewed with parents. NICU booklet left for review. Mom denies discomfort with pumping and will have DEBP for home use. Mom reports she is attempting to latch baby when visiting in NICU but baby not suckling well yet. Encouraged to keep offering breast when able. Call for assist as needed.   Maternal Data    Feeding Feeding Type: Formula Nipple Type: Slow - flow Length of feed: 30 min  LATCH Score/Interventions Latch: Too sleepy or reluctant, no latch achieved, no sucking elicited. Intervention(s): Skin to skin Intervention(s): Adjust position;Assist with latch  Audible Swallowing: None Intervention(s): Skin to skin  Type of Nipple: Everted at rest and after stimulation  Comfort (Breast/Nipple): Soft / non-tender     Hold (Positioning): Assistance needed to correctly position infant at breast and maintain latch. Intervention(s): Support Pillows  LATCH Score: 5  Lactation Tools Discussed/Used Tools: Pump Breast pump type: Double-Electric Breast Pump Pump Review: Setup, frequency, and cleaning;Milk Storage Initiated by:: RN Date initiated:: 11/29/14   Consult Status Consult Status: Follow-up Date: 11/30/14 Follow-up type: In-patient    Katrine Coho 11/29/2014, 6:45 PM

## 2014-11-29 NOTE — Progress Notes (Signed)
Patient ID: Carol [place], female   DOB: 10/25/1970, 44 y.o.   MRN: [id number] Patient not in room at this time / CNM will return for rounds later.  [name], M MSN, CNM 11/29/2014 9:50 AM

## 2014-11-29 NOTE — Progress Notes (Signed)
Per pt she has taken vicodin in the past without any problems.  Order placed for Vicodin 5/325 mg 1-2 tablets every 6 hrs prn moderate to sever pain.  [name], M MSN, CNM 11/29/2014 9:30 PM

## 2014-11-29 NOTE — Progress Notes (Signed)
Patient ID: Carol Houston Carol Houston Houston, female   DOB: 1970-09-06, 44 y.o.   MRN: [id number] Subjective: S/P Primary Cesarean Section for Arrest of Descent / polyhydramnios / CHTN / GDMA1 POD# 1 Information for the patient's newborn:  Carol Houston, Carol Houston Houston Carol Houston Carol Houston Carol Houston Houston [[id number]]  female  / circ infant in NICU - planning prior to d/c home  Reports feeling a little sore, but well Feeding: breast and bottle Patient reports tolerating PO.  Breast symptoms: producing little colostrum Pain controlled with ibuprofen (OTC) and narcotic analgesics including Percocet Denies HA/SOB/C/P/N/V/dizziness. Flatus absent. No BM. She reports vaginal bleeding as normal, without clots.  She is ambulating, urinating without difficult.     Objective:   VS:  Filed Vitals:   11/28/14 1925 11/28/14 2324 11/29/14 0345 11/29/14 0740  BP: 123/73 101/58 110/71 124/76  Pulse: 82 79 77 77  Temp: 98.2 F (36.8 C) 98.4 F (36.9 C) 98.5 F (36.9 C) 98.2 F (36.8 C)  TempSrc: Oral Oral Oral Oral  Resp: 18 18 18 18   Height:      Weight:      SpO2: 96% 96% 96%      Intake/Output Summary (Last 24 hours) at 11/29/14 1527 Last data filed at 11/29/14 1015  Gross per 24 hour  Intake 3867.99 ml  Output   1900 ml  Net 1967.99 ml        Recent Labs  11/27/14 1845 11/29/14 0610  WBC 9.2 9.3  HGB 11.9* 10.7*  HCT 36.6 33.2*  PLT 236 209     Blood type: --/--/O POS, O POS (10/13 0805)  Rubella: Immune (04/04 0000)     Physical Exam:   General: alert, cooperative, no distress and moderately obese  CV: Regular rate and rhythm, S1S2 present or without murmur or extra heart sounds  Resp: clear  Abdomen: soft, nontender, normal bowel sounds  Incision: serous drainage present and Negative pressure dressing overlying Honeycomb dressing   Uterine Fundus: firm, 1 FB below umbilicus, nontender  Lochia: minimal  Ext: edema trace and Homans sign is negative, no sign of DVT   Assessment/Plan: 44 y.o.   POD# 1.  S/P Cesarean Delivery.  Indications:  Arrest of Descent / polyhydramnios / CHTN / GDMA1                Principal Problem:   Postpartum care following cesarean delivery (10/14) Active Problems:   Chronic hypertension complicating or reason for care during pregnancy   Polyhydramnios  Doing well, stable.               Regular diet as tolerated Ambulate Routine post-op care Pump colostrum every 2 hours to increase supply and encourage breast milk  Graceann Congress, MSN, CNM 11/29/2014, 3:15 PM

## 2014-11-30 LAB — GLUCOSE, CAPILLARY: Glucose-Capillary: 76 mg/dL (ref 65–99)

## 2014-11-30 MED ORDER — HYDROCHLOROTHIAZIDE 25 MG PO TABS
25.0000 mg | ORAL_TABLET | Freq: Every day | ORAL | Status: DC
  Administered 2014-11-30: 25 mg via ORAL
  Filled 2014-11-30 (×2): qty 1

## 2014-11-30 NOTE — Progress Notes (Signed)
Pt B/P had been elevated, Nurse midwife Hester Mates came and saw pt. Ordered med.

## 2014-11-30 NOTE — Progress Notes (Signed)
Patient ID: Carol Houston, female   DOB: [birth year], 44 y.o.   MRN: [id number]  Patient not in room - in NICU with baby per family member. CNM will return later for rounds.  [name], M MSN, CNM 11/30/2014 9:58 AM

## 2014-11-30 NOTE — Progress Notes (Signed)
Patient ID: Carol Houston, female   DOB: [birth year], 44 y.o.   MRN: [id number] Subjective: S/P Primary Cesarean Delivery for Arrest of Descent / polyhydramnios / CHTN / GDMA1  POD# 2 Information for the patient's newborn:  Carol, [name] Carol Houston [[id number]]  female  / circ infant in NICU - planning prior to d/c home  Reports feeling better since taking Vicodin. Feeding: breast & bottle Patient reports tolerating PO.  Breast symptoms: small amounts of colostrum Pain controlled with ibuprofen (OTC) and narcotic analgesics including hydrocodone/acetaminophen (Lorcet, Lortab, Norco, Vicodin) Denies HA/SOB/C/P/N/V/dizziness. Flatus present. No BM. She reports vaginal bleeding as normal, without clots.  She is ambulating, urinating without difficult.     Objective:   VS:  Filed Vitals:   11/29/14 1828 11/29/14 1857 11/30/14 0600 11/30/14 1836  BP: 149/81 140/71 157/88 152/72  Pulse: 81 79 87 74  Temp: 97.9 F (36.6 C)  98.3 F (36.8 C)   TempSrc: Oral     Resp: 16  20 20   Height:      Weight:      SpO2: 98%       No intake or output data in the 24 hours ending 11/30/14 1925      Recent Labs  11/29/14 0610  WBC 9.3  HGB 10.7*  HCT 33.2*  PLT 209     Blood type: O POS (10/13 0805)  Rubella: Immune (04/04 0000)     Physical Exam:   General: alert, cooperative, no distress and moderately obese  Abdomen: soft, nontender, normal bowel sounds  Incision: serous drainage present and Negative Pressure dressing overlying Honeycomb dressing - 1/3 stained with dried serous drainage  Uterine Fundus: firm, 2 FB below umbilicus, nontender  Lochia: minimal  Ext: edema 2+ and Homans sign is negative, no sign of DVT   Assessment/Plan: 44 y.o.   POD# 2.  S/P Cesarean Delivery.  Indications: Arrest of Descent / polyhydramnios / CHTN / GDMA1                Principal Problem:   Postpartum care following cesarean delivery (10/14) Active Problems:   Chronic hypertension complicating or reason for care  during pregnancy   Polyhydramnios  Doing well, stable.               Regular diet as tolerated Start HCTZ 25 mg po daily x 5 days Encourage increased water intake Ambulate Routine post-op care  Carol Congress, MSN, CNM 11/30/2014, 7:25 PM

## 2014-11-30 NOTE — Plan of Care (Signed)
Problem: Discharge Progression Outcomes Goal: Barriers To Progression Addressed/Resolved Outcome: Progressing Started on B/P meds tonight by nurse midwife.

## 2014-12-01 ENCOUNTER — Encounter (HOSPITAL_COMMUNITY): Payer: Self-pay | Admitting: Obstetrics

## 2014-12-01 LAB — COMPREHENSIVE METABOLIC PANEL
ALT: 21 U/L (ref 14–54)
AST: 20 U/L (ref 15–41)
Albumin: 2.3 g/dL — ABNORMAL LOW (ref 3.5–5.0)
Alkaline Phosphatase: 126 U/L (ref 38–126)
Anion gap: 5 (ref 5–15)
BUN: 15 mg/dL (ref 6–20)
CO2: 23 mmol/L (ref 22–32)
Calcium: 8.6 mg/dL — ABNORMAL LOW (ref 8.9–10.3)
Chloride: 109 mmol/L (ref 101–111)
Creatinine, Ser: 0.92 mg/dL (ref 0.44–1.00)
GFR calc Af Amer: >60 mL/min (ref >60)
GFR calc non Af Amer: >60 mL/min (ref >60)
Glucose, Bld: 93 mg/dL (ref 65–99)
Potassium: 4 mmol/L (ref 3.5–5.1)
Sodium: 137 mmol/L (ref 135–145)
Total Bilirubin: 0.5 mg/dL (ref 0.3–1.2)
Total Protein: 5.9 g/dL — ABNORMAL LOW (ref 6.5–8.1)

## 2014-12-01 LAB — CBC
HCT: 30.3 % — ABNORMAL LOW (ref 36.0–46.0)
Hemoglobin: 9.8 g/dL — ABNORMAL LOW (ref 12.0–15.0)
MCH: 29.9 pg (ref 26.0–34.0)
MCHC: 32.3 g/dL (ref 30.0–36.0)
MCV: 92.4 fL (ref 78.0–100.0)
Platelets: 221 10*3/uL (ref 150–400)
RBC: 3.28 MIL/uL — ABNORMAL LOW (ref 3.87–5.11)
RDW: 15.7 % — ABNORMAL HIGH (ref 11.5–15.5)
WBC: 5.5 10*3/uL (ref 4.0–10.5)

## 2014-12-01 MED ORDER — IBUPROFEN 800 MG PO TABS: 800.0000 mg | ORAL_TABLET | Freq: Three times a day (TID) | ORAL | Status: DC

## 2014-12-01 MED ORDER — HYDROCODONE-ACETAMINOPHEN 5-325 MG PO TABS
1.0000 | ORAL_TABLET | Freq: Four times a day (QID) | ORAL | Status: DC | PRN
Start: 2014-12-01 — End: 2016-03-07

## 2014-12-01 NOTE — Progress Notes (Signed)
CSW acknowledges NICU admission.    Patient screened out for psychosocial assessment since none of the following apply:  Psychosocial stressors documented in mother or baby's chart  Gestation less than 32 weeks  Code at delivery   Critically ill infant  Infant with anomalies  Please contact the Clinical Social Worker if specific needs arise, or by MOB's request.  CSW notes history of situational depression in MOB's chart.  Documentation states "after father died."

## 2014-12-01 NOTE — Progress Notes (Signed)
POSTOPERATIVE DAY # 3 S/P CS / Hx chronic hypertension / obesity   S:         Reports feeling well - no PIH symptoms             Tolerating po intake / no nausea / no vomiting / + flatus / no BM             Bleeding is light             Pain controlled with motrin and percocet             Up ad lib / ambulatory/ voiding QS  Newborn breast feeding    O:  VS: BP 145/82 mmHg  Pulse 83  Temp(Src) 98 F (36.7 C) (Oral)  Resp 18  Ht 5\' 6"  (1.676 m)  Wt 118.389 kg (261 lb)  BMI 42.15 kg/m2  SpO2 98%  LMP 02/28/2014  Breastfeeding? Unknown              BP: 145/82 - 150/76 - 152/72 - 157/88   LABS:                          Bloodtype: --/--/O POS, O POS (10/13 0805)  Rubella: Immune (04/04 0000)               tdap and flu current    Results for Carol Houston, Carol Houston (MRN [id number]) as of 12/01/2014 10:45  Ref. Range 12/01/2014 08:48  Sodium Latest Ref Range: 135-145 mmol/L 137  Potassium Latest Ref Range: 3.5-5.1 mmol/L 4.0  Creatinine Latest Ref Range: 0.44-1.00 mg/dL 0.92  Glucose Latest Ref Range: 65-99 mg/dL 93  AST Latest Ref Range: 15-41 U/L 20  ALT Latest Ref Range: 14-54 U/L 21  WBC Latest Ref Range: 4.0-10.5 K/uL 5.5  Hemoglobin Latest Ref Range: 12.0-15.0 g/dL 9.8 (L)  HCT Latest Ref Range: 36.0-46.0 % 30.3 (L)  Platelets Latest Ref Range: 150-400 K/uL 221                                   Physical Exam:             Alert and Oriented X3  Lungs: Clear and unlabored  Heart: regular rate and rhythm / no mumurs  Abdomen: soft, non-tender, non-distended, active BS             Fundus: firm, non-tender, Ueven             Dressing intact - wound vac in place              Incision:  approximated with suture / no erythema / no ecchymosis / marked dried drainage    Extremities: 1+ pedal edema, no calf pain or tenderness, negative Homans  A:        POD # 3 S/P CS - wound vac in place            Obesity            HX chronic hypertension - mild elevated BP without evidence of  PEC  P:        Routine postoperative care              DC home             Follow-up at [facility] with MD 2-4 days   Artelia Laroche CNM, MSN, [facility] 12/01/2014, 8:24 AM

## 2014-12-01 NOTE — Progress Notes (Signed)
RN requested checking flange sizes of pump before going home.  Checked using DEBP. Right nipple smaller than left.  Left flange size #27 seems appropriate at this time but gave mother #30 flanges in case nipple starts rubbing.  Discussed lubricating flange and milk transportation.

## 2014-12-01 NOTE — Discharge Summary (Signed)
OB Discharge Summary  Patient Name: Carol Houston DOB: Jan 26, 1971 MRN: [id number]  Date of admission: 11/27/2014  Admitting diagnosis: INDUCTION - chronic HTN / GDMa2 versus DM2 / poolyhydramnios Intrauterine pregnancy: [redacted]w[redacted]d     Secondary diagnosis: Chronic Hypertension, Type II Diabetes Mellitus and polyhydramnios   Date of discharge: 12/01/2014     Discharge diagnosis: Term Pregnancy Delivered - arrest of descent / POD 3 s/p cesarean section / obesity / chronic HTN / glucose intolerance - DM2   Prenatal history: G2P1011   EDC : 12/10/2014, by Other Basis  Prenatal care at [facility]  Primary provider : Pamala Hurry Prenatal course complicated by Obesity / infertility / chronic HTN / GDMa2 versus DM2 with pre-existing glucose intolerance / polyhydamnios / + GBS  Prenatal Labs: ABO, Rh: --/--/O POS, O POS (10/13 0805) Antibody: NEG (10/13 0805) Rubella: Immune (04/04 0000)   RPR: Non Reactive (10/13 0805)  HBsAg: Negative (04/04 0000)  HIV: Non-reactive (04/04 0000)  GBS: Positive (09/26 0000)                                    Hospital course:  Onset of Labor With Unplanned C/S  44 y.o. yo G2P1011 at [redacted]w[redacted]d was admitted induction of labor chronic HTN, obesity, GDMa2 versus DM2, polyhydramnios on 11/27/2014. Patient had a labor course significant for arrest of descent after 2 hr active second stage. Membrane Rupture Time/Date: 5:45 PM ,11/27/2014   The patient went for cesarean section due to arrest of descent, and delivered a Viable infant,11/28/2014  Details of operation can be found in separate operative note. Patient had an uncomplicated postpartum course.  She is ambulating,tolerating a regular diet, passing flatus, and urinating well.  Patient is discharged home in stable condition No discharge date for patient encounter..  Augmentation: AROM, Pitocin and Cytotec Delivering PROVIDER: Aloha Gell                                                             Complications: None  Newborn Data: Live born female  Birth Weight: 7 lb 9.3 oz (3440 g) APGAR: 8, 9  Baby Feeding: Breast Disposition:NICU  Post partum procedures:none  Postpartum contraception: Not Discussed    Labs: Lab Results  Component Value Date   WBC 5.5 12/01/2014   HGB 9.8* 12/01/2014   HCT 30.3* 12/01/2014   MCV 92.4 12/01/2014   PLT 221 12/01/2014   CMP Latest Ref Rng 12/01/2014  Glucose 65 - 99 mg/dL 93  BUN 6 - 20 mg/dL 15  Creatinine 0.44 - 1.00 mg/dL 0.92  Sodium 135 - 145 mmol/L 137  Potassium 3.5 - 5.1 mmol/L 4.0  Chloride 101 - 111 mmol/L 109  CO2 22 - 32 mmol/L 23  Calcium 8.9 - 10.3 mg/dL 8.6(L)  Total Protein 6.5 - 8.1 g/dL 5.9(L)  Total Bilirubin 0.3 - 1.2 mg/dL 0.5  Alkaline Phos 38 - 126 U/L 126  AST 15 - 41 U/L 20  ALT 14 - 54 U/L 21    Physical Exam @ time of discharge:  Filed Vitals:   11/30/14 0600 11/30/14 1836 11/30/14 2141 12/01/14 0600  BP: 157/88 152/72 150/76 145/82  Pulse: 87 74  83  Temp: 98.3 F (36.8 C)   98 F (36.7 C)  TempSrc:      Resp: 20 20  18   Height:      Weight:      SpO2:        General: alert Lochia: appropriate Uterine Fundus: firm Perineum: intact Incision: Healing well with no significant drainage Extremities: 1+ dependent edema DVT Evaluation: No evidence of DVT seen on physical exam.   Discharge instructions:  "Baby and Me Booklet" and [place] Booklet  Discharge Medications:    Medication List    STOP taking these medications        calcium carbonate 500 MG chewable tablet  Commonly known as:  TUMS - dosed in mg elemental calcium      TAKE these medications        aspirin 81 MG chewable tablet  Chew 81 mg by mouth daily.     HYDROcodone-acetaminophen 5-325 MG tablet  Commonly known as:  NORCO/VICODIN  Take 1-2 tablets by mouth every 6 (six) hours as needed for moderate pain or severe pain.     ibuprofen 800 MG tablet  Commonly known as:  ADVIL,MOTRIN  Take 1 tablet (800  mg total) by mouth 3 (three) times daily.     metFORMIN 750 MG 24 hr tablet  Commonly known as:  GLUCOPHAGE-XR  Take 1 tablet by mouth 2 (two) times daily.     PRENATAL VITAMIN PO  Take 1 tablet by mouth daily.        Diet: carb modified diet and low sodium diet  Activity: Advance as tolerated. Pelvic rest x 6 weeks.   Follow up:2-3 days with DR Pamala Hurry - wound vac care                    Postpartum 6 weeks PP care    Signed: Artelia Laroche CNM, MSN, [facility] 12/01/2014, 11:14 AM

## 2014-12-01 NOTE — Lactation Note (Signed)
This note was copied from the chart of [place]. Lactation Consultation Note  Patient Name: Carol Houston WSFKC'L Date: 12/01/2014 Reason for consult: Follow-up assessment;NICU baby NICU baby 61 hours old. Mom is offering baby a bottle of formula when this LC met with patient in NICU. Mom states that baby frustrated at breast because she doesn't have much to offer. Mom reports that she has been pumping every 3-4 hours since yesterday. Enc mom to pump at least 8 times/24 hours for 15 minutes. Enc mom to hand express after pumping. Discussed normal progression of milk coming to volume and enc mom to offer lots of STS/Kangaroo care and nuzzling/latching at breast. Enc mom to call at next feeding for [facility] assist with latching as needed. Mom aware of NICU pumping rooms and has a personal pump at home. Mom aware of OP/BFSG and [place] phone line assistance after D/C.  Maternal Data    Feeding    LATCH Score/Interventions                      Lactation Tools Discussed/Used     Consult Status Consult Status: PRN    Inocente Salles 12/01/2014, 10:09 AM

## 2014-12-01 NOTE — Lactation Note (Signed)
This note was copied from the chart of [place]. Lactation Consultation Note  Patient Name: Carol Houston TSVXB'L Date: 12/01/2014 Reason for consult: Follow-up assessment;NICU baby NICU baby 58 hours old. Called to NICU to assist with latching baby in football position to right breast. Baby cueing to nurse but fussy at breast, suckled a couple of times, then fell asleep. Discussed with parents that it is never a wasted effort at breast. Discussed benefit to baby and mom and mom's milk supply. Mom states that she is seeing a little more colostrum with pumping. Enc mom to keep baby STS at breast. Enc mom to offer as much STS and latching as she can.   Maternal Data    Feeding Feeding Type: Breast Fed Nipple Type: Slow - flow Length of feed: 0 min  LATCH Score/Interventions Latch: Too sleepy or reluctant, no latch achieved, no sucking elicited. Intervention(s): Skin to skin;Waking techniques Intervention(s): Adjust position;Assist with latch;Breast compression  Audible Swallowing: None Intervention(s): Skin to skin;Hand expression  Type of Nipple: Everted at rest and after stimulation  Comfort (Breast/Nipple): Soft / non-tender     Hold (Positioning): Assistance needed to correctly position infant at breast and maintain latch. Intervention(s): Breastfeeding basics reviewed;Position options  LATCH Score: 5  Lactation Tools Discussed/Used     Consult Status Consult Status: Follow-up Date: 12/02/14 Follow-up type: In-patient    Inocente Salles 12/01/2014, 2:23 PM

## 2014-12-10 ENCOUNTER — Inpatient Hospital Stay (HOSPITAL_COMMUNITY): Admission: AD | Admit: 2014-12-10 | Payer: BLUE CROSS/BLUE SHIELD | Source: Ambulatory Visit | Admitting: Obstetrics

## 2015-05-15 ENCOUNTER — Encounter: Payer: Self-pay | Admitting: Family Medicine

## 2015-05-15 ENCOUNTER — Ambulatory Visit (INDEPENDENT_AMBULATORY_CARE_PROVIDER_SITE_OTHER): Payer: BLUE CROSS/BLUE SHIELD | Admitting: Family Medicine

## 2015-05-15 ENCOUNTER — Other Ambulatory Visit: Payer: Self-pay | Admitting: Family Medicine

## 2015-05-15 VITALS — BP 142/90 | HR 98 | Temp 98.5°F | Ht 66.0 in | Wt 249.2 lb

## 2015-05-15 DIAGNOSIS — Z0000 Encounter for general adult medical examination without abnormal findings: Secondary | ICD-10-CM

## 2015-05-15 DIAGNOSIS — E669 Obesity, unspecified: Secondary | ICD-10-CM | POA: Insufficient documentation

## 2015-05-15 LAB — CBC WITH DIFFERENTIAL/PLATELET
Basophils Absolute: 0 10*3/uL (ref 0.0–0.1)
Basophils Relative: 0 % (ref 0–1)
Eosinophils Absolute: 0.1 10*3/uL (ref 0.0–0.7)
Eosinophils Relative: 1 % (ref 0–5)
HCT: 34.9 % — ABNORMAL LOW (ref 36.0–46.0)
Hemoglobin: 10.9 g/dL — ABNORMAL LOW (ref 12.0–15.0)
Lymphocytes Relative: 30 % (ref 12–46)
Lymphs Abs: 1.8 10*3/uL (ref 0.7–4.0)
MCH: 25.8 pg — ABNORMAL LOW (ref 26.0–34.0)
MCHC: 31.2 g/dL (ref 30.0–36.0)
MCV: 82.5 fL (ref 78.0–100.0)
MPV: 9.1 fL (ref 8.6–12.4)
Monocytes Absolute: 0.5 10*3/uL (ref 0.1–1.0)
Monocytes Relative: 8 % (ref 3–12)
Neutro Abs: 3.6 10*3/uL (ref 1.7–7.7)
Neutrophils Relative %: 61 % (ref 43–77)
Platelets: 401 10*3/uL — ABNORMAL HIGH (ref 150–400)
RBC: 4.23 MIL/uL (ref 3.87–5.11)
RDW: 14.2 % (ref 11.5–15.5)
WBC: 5.9 10*3/uL (ref 4.0–10.5)

## 2015-05-15 LAB — LIPID PANEL
Cholesterol: 160 mg/dL (ref 125–200)
HDL: 78 mg/dL (ref >=46)
LDL Cholesterol: 66 mg/dL (ref <130)
Total CHOL/HDL Ratio: 2.1 Ratio (ref <=5.0)
Triglycerides: 79 mg/dL (ref <150)
VLDL: 16 mg/dL (ref <30)

## 2015-05-15 LAB — HEPATIC FUNCTION PANEL
ALT: 19 U/L (ref 6–29)
AST: 16 U/L (ref 10–30)
Albumin: 4.3 g/dL (ref 3.6–5.1)
Alkaline Phosphatase: 135 U/L — ABNORMAL HIGH (ref 33–115)
Bilirubin, Direct: 0.1 mg/dL (ref <=0.2)
Indirect Bilirubin: 0.4 mg/dL (ref 0.2–1.2)
Total Bilirubin: 0.5 mg/dL (ref 0.2–1.2)
Total Protein: 6.9 g/dL (ref 6.1–8.1)

## 2015-05-15 LAB — BASIC METABOLIC PANEL
BUN: 10 mg/dL (ref 7–25)
CO2: 23 mmol/L (ref 20–31)
Calcium: 8.7 mg/dL (ref 8.6–10.2)
Chloride: 104 mmol/L (ref 98–110)
Creat: 0.68 mg/dL (ref 0.50–1.10)
Glucose, Bld: 92 mg/dL (ref 65–99)
Potassium: 4 mmol/L (ref 3.5–5.3)
Sodium: 139 mmol/L (ref 135–146)

## 2015-05-15 LAB — HEMOGLOBIN A1C
Hgb A1c MFr Bld: 5.9 % — ABNORMAL HIGH (ref <5.7)
Mean Plasma Glucose: 123 mg/dL

## 2015-05-15 LAB — TSH: TSH: 1.23 mIU/L

## 2015-05-15 NOTE — Assessment & Plan Note (Signed)
Pt's PE WNL w/ exception of obesity.  UTD on GYN.  Check labs.  Anticipatory guidance provided.  

## 2015-05-15 NOTE — Progress Notes (Signed)
   Subjective:    Patient ID: Carol Houston, female    DOB: [birth year], 45 y.o.   MRN: [id number]  HPI CPE- UTD on pap w/ Dr Valentino Saxon.  On Metformin for PCOS.  Had gestational DM.     Review of Systems Patient reports no vision/ hearing changes, adenopathy,fever, weight change,  persistant/recurrent hoarseness , swallowing issues, chest pain, palpitations, edema, persistant/recurrent cough, hemoptysis, dyspnea (rest/exertional/paroxysmal nocturnal), gastrointestinal bleeding (melena, rectal bleeding), abdominal pain, significant heartburn, bowel changes, GU symptoms (dysuria, hematuria, incontinence), Gyn symptoms (abnormal  bleeding, pain),  syncope, focal weakness, memory loss, numbness & tingling, skin/hair/nail changes, abnormal bruising or bleeding, anxiety, or depression.     Objective:   Physical Exam General Appearance:    Alert, cooperative, no distress, appears stated age, obese  Head:    Normocephalic, without obvious abnormality, atraumatic  Eyes:    PERRL, conjunctiva/corneas clear, EOM's intact, fundi    benign, both eyes  Ears:    Normal TM's and external ear canals, both ears  Nose:   Nares normal, septum midline, mucosa normal, no drainage    or sinus tenderness  Throat:   Lips, mucosa, and tongue normal; teeth and gums normal  Neck:   Supple, symmetrical, trachea midline, no adenopathy;    Thyroid: no enlargement/tenderness/nodules  Back:     Symmetric, no curvature, ROM normal, no CVA tenderness  Lungs:     Clear to auscultation bilaterally, respirations unlabored  Chest Wall:    No tenderness or deformity   Heart:    Regular rate and rhythm, S1 and S2 normal, no murmur, rub   or gallop  Breast Exam:    Deferred to GYN  Abdomen:     Soft, non-tender, bowel sounds active all four quadrants,    no masses, no organomegaly  Genitalia:    Deferred to GYN  Rectal:    Extremities:   Extremities normal, atraumatic, no cyanosis or edema  Pulses:   2+ and symmetric all  extremities  Skin:   Skin color, texture, turgor normal, no rashes or lesions  Lymph nodes:   Cervical, supraclavicular, and axillary nodes normal  Neurologic:   CNII-XII intact, normal strength, sensation and reflexes    throughout          Assessment & Plan:

## 2015-05-15 NOTE — Patient Instructions (Signed)
Follow up in 3 months to recheck BP and weight loss progress We'll notify you of your lab results and make any changes if needed Try and schedule daily exercise and plan ahead for meals so you can avoid take out Call with any questions or concerns Thanks for sticking with [place]! Congrats on your beautiful baby!!!

## 2015-05-15 NOTE — Assessment & Plan Note (Signed)
New.  Stressed need for healthy diet and regular exercise.  Encouraged her to work on meal planning and scheduling activity.  Will follow.

## 2015-05-16 LAB — VITAMIN D 25 HYDROXY (VIT D DEFICIENCY, FRACTURES): Vit D, 25-Hydroxy: 18 ng/mL — ABNORMAL LOW (ref 30–100)

## 2015-05-18 NOTE — Progress Notes (Signed)
4/3 Labs added

## 2015-05-19 ENCOUNTER — Other Ambulatory Visit: Payer: Self-pay | Admitting: General Practice

## 2015-05-19 LAB — GAMMA GT: GGT: 18 U/L (ref 7–51)

## 2015-05-19 MED ORDER — VITAMIN D (ERGOCALCIFEROL) 1.25 MG (50000 UNIT) PO CAPS: 50000.0000 [IU] | ORAL_CAPSULE | ORAL | Status: DC

## 2015-08-10 ENCOUNTER — Ambulatory Visit: Payer: BLUE CROSS/BLUE SHIELD | Admitting: Family Medicine

## 2015-08-21 ENCOUNTER — Other Ambulatory Visit: Payer: Self-pay | Admitting: Family Medicine

## 2015-08-21 NOTE — Telephone Encounter (Signed)
Med denied, pt needs to continue OTC vitamin D 2000iu daily.  

## 2016-01-19 ENCOUNTER — Ambulatory Visit (INDEPENDENT_AMBULATORY_CARE_PROVIDER_SITE_OTHER): Payer: 59 | Admitting: Family Medicine

## 2016-01-19 ENCOUNTER — Encounter: Payer: Self-pay | Admitting: Family Medicine

## 2016-01-19 VITALS — BP 133/86 | HR 101 | Temp 98.4°F | Resp 16 | Ht 66.0 in | Wt 262.4 lb

## 2016-01-19 DIAGNOSIS — S060X0D Concussion without loss of consciousness, subsequent encounter: Secondary | ICD-10-CM

## 2016-01-19 NOTE — Progress Notes (Signed)
Pre visit review using our clinic review tool, if applicable. No additional management support is needed unless otherwise documented below in the visit note. 

## 2016-01-19 NOTE — Patient Instructions (Signed)
Follow up as needed/scheduled You do have a concussion and this will improve w/ time and rest Try and limit screen time!  This will worsen your headaches!!! Drink plenty of fluids REST! You are able to walk w/o restriction but if you try and exercise and develop a headache- scale back your activity level!!! Mental rest!!!! Call with any questions or concerns Hang in there! Happy Holidays!!!

## 2016-01-19 NOTE — Progress Notes (Signed)
   Subjective:    Patient ID: Carol Houston, female    DOB: [birth year], 45 y.o.   MRN: [id number]  HPI Head injury- pt was getting Christmas decorations out of the attic on Saturday and a ceramic house fell out of box and hit her on crown of head.  No LOC.  No bleeding.  + dizziness and 'extremely sleepy'.  Went to UC immediately after incident and was dx'd w/ mild concussion.  No imaging done.  Pt continues to have headache, dizziness w/ shaking her head.  No visual changes.  Pt was told to take ibuprofen as needed for pain.  Initial nausea has resolved.   Review of Systems For ROS see HPI     Objective:   Physical Exam  Constitutional: She is oriented to person, place, and time. She appears well-developed and well-nourished. No distress.  HENT:  Head: Normocephalic and atraumatic.  TMs WNL Minimal nasal congestion  Eyes: Conjunctivae and EOM are normal. Pupils are equal, round, and reactive to light.  Neck: Normal range of motion. Neck supple.  Cardiovascular: Normal rate, regular rhythm, normal heart sounds and intact distal pulses.   Pulmonary/Chest: Effort normal and breath sounds normal. No respiratory distress. She has no wheezes. She has no rales.  Lymphadenopathy:    She has no cervical adenopathy.  Neurological: She is alert and oriented to person, place, and time. She has normal reflexes. No cranial nerve deficit. Coordination normal.  Gait WNL, able to step up and turn on exam table step w/o difficulty  Skin: Skin is warm and dry.  Psychiatric: She has a normal mood and affect. Her behavior is normal. Judgment and thought content normal.  Vitals reviewed.         Assessment & Plan:  Concussion- new.  Pt sustained injury on Saturday and was seen by UC on same day.  dx'd at that time w/ concussion.  sxs are improving in the interim- less dizziness, no nausea, no visual changes.  No need for imaging at this time.  Reviewed supportive care and red flags that should prompt  return.  Work note provided.  Pt expressed understanding and is in agreement w/ plan.

## 2016-02-02 LAB — HM PAP SMEAR

## 2016-02-02 LAB — HM MAMMOGRAPHY: HM Mammogram: NORMAL (ref 0–4)

## 2016-03-07 ENCOUNTER — Ambulatory Visit (INDEPENDENT_AMBULATORY_CARE_PROVIDER_SITE_OTHER): Payer: 59 | Admitting: Family Medicine

## 2016-03-07 ENCOUNTER — Encounter: Payer: Self-pay | Admitting: Family Medicine

## 2016-03-07 VITALS — BP 134/93 | HR 106 | Temp 98.5°F | Resp 18 | Ht 66.0 in | Wt 263.1 lb

## 2016-03-07 DIAGNOSIS — H66001 Acute suppurative otitis media without spontaneous rupture of ear drum, right ear: Secondary | ICD-10-CM

## 2016-03-07 MED ORDER — PROMETHAZINE-DM 6.25-15 MG/5ML PO SYRP: 5.0000 mL | ORAL_SOLUTION | Freq: Four times a day (QID) | ORAL | 0 refills | Status: DC | PRN

## 2016-03-07 MED ORDER — AMOXICILLIN-POT CLAVULANATE 875-125 MG PO TABS: 1.0000 | ORAL_TABLET | Freq: Two times a day (BID) | ORAL | 0 refills | Status: DC

## 2016-03-07 NOTE — Progress Notes (Signed)
   Subjective:    Patient ID: Carol Houston, female    DOB: [birth year], 46 y.o.   MRN: [id number]  HPI ILI- pt was seen at [facility] on 1/18 and despite testing (-) for flu given her high fever and the fact that her whole family was sick.  Son recently dx'd w/ PNA and husband w/ bronchitis.  Fever last night was 102.8, this AM was '101 point something'.  Denies sinus pain/pressure.  'very bad cough' w/ R ear pain.  Cough is now productive.   Review of Systems For ROS see HPI     Objective:   Physical Exam  Constitutional: She is oriented to person, place, and time. She appears well-developed and well-nourished. No distress.  HENT:  Head: Normocephalic and atraumatic.  Nose: Nose normal.  Mouth/Throat: Oropharynx is clear and moist. No oropharyngeal exudate.  R TM dull, erythematous, bulging L TM w/ clear fluid preset No TTP over frontal or maxillary sinuses  Eyes: Conjunctivae and EOM are normal. Pupils are equal, round, and reactive to light.  Neck: Normal range of motion. Neck supple.  Lymphadenopathy:    She has cervical adenopathy.  Neurological: She is alert and oriented to person, place, and time.  Skin: Skin is warm and dry.  Psychiatric: She has a normal mood and affect. Her behavior is normal. Thought content normal.  Vitals reviewed.         Assessment & Plan:  R OM- new.  Pt's R TM very red and painful appearing.  Start Augmentin which should cover both the ear and the chest process.  Cough meds prn.  Reviewed supportive care and red flags that should prompt return.  Pt expressed understanding and is in agreement w/ plan.

## 2016-03-07 NOTE — Progress Notes (Signed)
Pre visit review using our clinic review tool, if applicable. No additional management support is needed unless otherwise documented below in the visit note. 

## 2016-03-07 NOTE — Patient Instructions (Signed)
Follow up as needed Start the Augmentin twice daily- take w/ food Drink plenty of fluids REST! Alternate tylenol/ibuprofen every 4 hrs for pain/fever Use the cough syrup as needed- will cause drowsiness Mucinex DM for daytime cough/congestion- will not cause drowsiness Call with any questions or concerns Hang in there!!!

## 2016-04-11 ENCOUNTER — Telehealth: Payer: Self-pay | Admitting: General Practice

## 2016-04-11 ENCOUNTER — Encounter: Payer: Self-pay | Admitting: General Practice

## 2016-04-11 NOTE — Telephone Encounter (Signed)
Called pt to verify what dates she missed from work? Voicemail was full mychart also sent.

## 2016-04-13 NOTE — Telephone Encounter (Signed)
Called patient, no answer.  Voice mail box full, unable to leave message for patient.

## 2016-04-13 NOTE — Telephone Encounter (Signed)
Noted, paperwork completed and will be faxed in today.

## 2016-04-13 NOTE — Telephone Encounter (Signed)
I tried calling pt and could not leave a voicemail and I sent a mychart. Could you try calling pt to verify what 7 days she missed so we can complete her FMLA paperwork?

## 2016-04-13 NOTE — Telephone Encounter (Signed)
Patient was out - January 15-23, returned to work on 03-09-16

## 2016-04-19 NOTE — Telephone Encounter (Signed)
Patient called and said that as of yesterday her FMLA paperwork had still not been received.   She is asking if it can be sent again.

## 2016-04-22 ENCOUNTER — Encounter: Payer: Self-pay | Admitting: General Practice

## 2016-04-22 ENCOUNTER — Encounter: Payer: 59 | Admitting: Family Medicine

## 2016-04-27 ENCOUNTER — Ambulatory Visit (INDEPENDENT_AMBULATORY_CARE_PROVIDER_SITE_OTHER): Payer: 59 | Admitting: Family Medicine

## 2016-04-27 ENCOUNTER — Encounter: Payer: Self-pay | Admitting: Family Medicine

## 2016-04-27 VITALS — BP 136/86 | HR 92 | Temp 98.2°F | Resp 16 | Ht 66.0 in | Wt 267.1 lb

## 2016-04-27 DIAGNOSIS — Z0000 Encounter for general adult medical examination without abnormal findings: Secondary | ICD-10-CM

## 2016-04-27 LAB — HEPATIC FUNCTION PANEL
ALT: 22 U/L (ref 0–35)
AST: 16 U/L (ref 0–37)
Albumin: 4.1 g/dL (ref 3.5–5.2)
Alkaline Phosphatase: 157 U/L — ABNORMAL HIGH (ref 39–117)
Bilirubin, Direct: 0.1 mg/dL (ref 0.0–0.3)
Total Bilirubin: 0.5 mg/dL (ref 0.2–1.2)
Total Protein: 7.2 g/dL (ref 6.0–8.3)

## 2016-04-27 LAB — CBC WITH DIFFERENTIAL/PLATELET
Basophils Absolute: 0 10*3/uL (ref 0.0–0.1)
Basophils Relative: 0.5 % (ref 0.0–3.0)
Eosinophils Absolute: 0.1 10*3/uL (ref 0.0–0.7)
Eosinophils Relative: 1.1 % (ref 0.0–5.0)
HCT: 38.1 % (ref 36.0–46.0)
Hemoglobin: 12.1 g/dL (ref 12.0–15.0)
Lymphocytes Relative: 25.4 % (ref 12.0–46.0)
Lymphs Abs: 1.8 10*3/uL (ref 0.7–4.0)
MCHC: 31.7 g/dL (ref 30.0–36.0)
MCV: 79.2 fl (ref 78.0–100.0)
Monocytes Absolute: 0.6 10*3/uL (ref 0.1–1.0)
Monocytes Relative: 8.2 % (ref 3.0–12.0)
Neutro Abs: 4.6 10*3/uL (ref 1.4–7.7)
Neutrophils Relative %: 64.8 % (ref 43.0–77.0)
Platelets: 356 10*3/uL (ref 150.0–400.0)
RBC: 4.81 Mil/uL (ref 3.87–5.11)
RDW: 18.5 % — ABNORMAL HIGH (ref 11.5–15.5)
WBC: 7.1 10*3/uL (ref 4.0–10.5)

## 2016-04-27 LAB — BASIC METABOLIC PANEL
BUN: 11 mg/dL (ref 6–23)
CO2: 27 mEq/L (ref 19–32)
Calcium: 9.6 mg/dL (ref 8.4–10.5)
Chloride: 105 mEq/L (ref 96–112)
Creatinine, Ser: 0.74 mg/dL (ref 0.40–1.20)
GFR: 90.94 mL/min (ref >60.00)
Glucose, Bld: 115 mg/dL — ABNORMAL HIGH (ref 70–99)
Potassium: 4.9 mEq/L (ref 3.5–5.1)
Sodium: 141 mEq/L (ref 135–145)

## 2016-04-27 LAB — LIPID PANEL
Cholesterol: 146 mg/dL (ref 0–200)
HDL: 58.9 mg/dL (ref >39.00)
LDL Cholesterol: 71 mg/dL (ref 0–99)
NonHDL: 86.9
Total CHOL/HDL Ratio: 2
Triglycerides: 81 mg/dL (ref 0.0–149.0)
VLDL: 16.2 mg/dL (ref 0.0–40.0)

## 2016-04-27 LAB — VITAMIN D 25 HYDROXY (VIT D DEFICIENCY, FRACTURES): VITD: 19.29 ng/mL — ABNORMAL LOW (ref 30.00–100.00)

## 2016-04-27 LAB — TSH: TSH: 1.54 u[IU]/mL (ref 0.35–4.50)

## 2016-04-27 NOTE — Progress Notes (Signed)
   Subjective:    Patient ID: Carol Houston, female    DOB: [birth year], 46 y.o.   MRN: [id number]  HPI CPE- UTD on GYN (Dr Valentino Saxon).  Son is 17 months so pt got Tdap during pregnancy.     Review of Systems Patient reports no vision/ hearing changes, adenopathy,fever, weight change,  persistant/recurrent hoarseness , swallowing issues, chest pain, palpitations, edema, persistant/recurrent cough, hemoptysis, dyspnea (rest/exertional/paroxysmal nocturnal), gastrointestinal bleeding (melena, rectal bleeding), abdominal pain, significant heartburn, bowel changes, GU symptoms (dysuria, hematuria, incontinence), Gyn symptoms (abnormal  bleeding, pain),  syncope, focal weakness, memory loss, numbness & tingling, skin/hair/nail changes, abnormal bruising or bleeding, anxiety, or depression.     Objective:   Physical Exam . General Appearance:    Alert, cooperative, no distress, appears stated age, obese  Head:    Normocephalic, without obvious abnormality, atraumatic  Eyes:    PERRL, conjunctiva/corneas clear, EOM's intact, fundi    benign, both eyes  Ears:    Normal TM's and external ear canals, both ears  Nose:   Nares normal, septum midline, mucosa normal, no drainage    or sinus tenderness  Throat:   Lips, mucosa, and tongue normal; teeth and gums normal  Neck:   Supple, symmetrical, trachea midline, no adenopathy;    Thyroid: no enlargement/tenderness/nodules  Back:     Symmetric, no curvature, ROM normal, no CVA tenderness  Lungs:     Clear to auscultation bilaterally, respirations unlabored  Chest Wall:    No tenderness or deformity   Heart:    Regular rate and rhythm, S1 and S2 normal, no murmur, rub   or gallop  Breast Exam:    Deferred to GYN  Abdomen:     Soft, non-tender, bowel sounds active all four quadrants,    no masses, no organomegaly  Genitalia:    Deferred to GYN  Rectal:    Extremities:   Extremities normal, atraumatic, no cyanosis or edema  Pulses:   2+ and symmetric all  extremities  Skin:   Skin color, texture, turgor normal, no rashes or lesions  Lymph nodes:   Cervical, supraclavicular, and axillary nodes normal  Neurologic:   CNII-XII intact, normal strength, sensation and reflexes    throughout          Assessment & Plan:

## 2016-04-27 NOTE — Progress Notes (Signed)
Pre visit review using our clinic review tool, if applicable. No additional management support is needed unless otherwise documented below in the visit note. 

## 2016-04-27 NOTE — Assessment & Plan Note (Signed)
Pt's PE WNL w/ exception of obesity.  UTD on GYN and Tdap.  Check labs.  Anticipatory guidance provided.

## 2016-04-27 NOTE — Patient Instructions (Signed)
Follow up in 6 months to recheck weight loss progress We'll notify you of your lab results and make any changes if needed Continue to work on healthy diet and regular exercise- you can do it!!! Call with any questions or concerns [place]!!!

## 2016-04-29 ENCOUNTER — Other Ambulatory Visit (INDEPENDENT_AMBULATORY_CARE_PROVIDER_SITE_OTHER): Payer: 59

## 2016-04-29 ENCOUNTER — Other Ambulatory Visit: Payer: Self-pay | Admitting: General Practice

## 2016-04-29 DIAGNOSIS — R748 Abnormal levels of other serum enzymes: Secondary | ICD-10-CM | POA: Diagnosis not present

## 2016-04-29 DIAGNOSIS — R7309 Other abnormal glucose: Secondary | ICD-10-CM

## 2016-04-29 LAB — GAMMA GT: GGT: 33 U/L (ref 7–51)

## 2016-04-29 LAB — HEMOGLOBIN A1C: Hgb A1c MFr Bld: 6.7 % — ABNORMAL HIGH (ref 4.6–6.5)

## 2016-04-29 MED ORDER — VITAMIN D (ERGOCALCIFEROL) 1.25 MG (50000 UNIT) PO CAPS
50000.0000 [IU] | ORAL_CAPSULE | ORAL | 0 refills | Status: DC
Start: 2016-04-29 — End: 2016-07-22

## 2016-04-30 ENCOUNTER — Encounter: Payer: Self-pay | Admitting: Family Medicine

## 2016-05-03 ENCOUNTER — Encounter: Payer: Self-pay | Admitting: Family Medicine

## 2016-07-22 ENCOUNTER — Other Ambulatory Visit: Payer: Self-pay | Admitting: Family Medicine

## 2016-07-22 NOTE — Telephone Encounter (Signed)
Please advise, last OV 04/27/16 Vitamin d last filled 04/29/16 #12 with 0  Vitamin d level was 19

## 2016-10-11 ENCOUNTER — Other Ambulatory Visit: Payer: Self-pay | Admitting: Family Medicine

## 2016-10-24 ENCOUNTER — Ambulatory Visit: Payer: 59 | Admitting: Family Medicine

## 2016-12-05 ENCOUNTER — Ambulatory Visit: Payer: Self-pay | Admitting: Family Medicine

## 2017-07-04 ENCOUNTER — Encounter: Payer: Self-pay | Admitting: General Practice

## 2017-11-08 ENCOUNTER — Encounter: Payer: Self-pay | Admitting: Family Medicine

## 2017-11-08 ENCOUNTER — Other Ambulatory Visit: Payer: Self-pay

## 2017-11-08 ENCOUNTER — Ambulatory Visit: Payer: BLUE CROSS/BLUE SHIELD | Admitting: Family Medicine

## 2017-11-08 VITALS — BP 122/83 | HR 107 | Temp 98.3°F | Resp 16 | Ht 66.0 in | Wt 242.0 lb

## 2017-11-08 DIAGNOSIS — E669 Obesity, unspecified: Secondary | ICD-10-CM

## 2017-11-08 DIAGNOSIS — E282 Polycystic ovarian syndrome: Secondary | ICD-10-CM | POA: Diagnosis not present

## 2017-11-08 DIAGNOSIS — R110 Nausea: Secondary | ICD-10-CM | POA: Diagnosis not present

## 2017-11-08 DIAGNOSIS — Z23 Encounter for immunization: Secondary | ICD-10-CM | POA: Diagnosis not present

## 2017-11-08 LAB — POCT URINALYSIS DIPSTICK
Bilirubin, UA: NEGATIVE
Blood, UA: NEGATIVE
Glucose, UA: NEGATIVE
Leukocytes, UA: NEGATIVE
Nitrite, UA: NEGATIVE
Protein, UA: NEGATIVE
Spec Grav, UA: 1.01 (ref 1.010–1.025)
Urobilinogen, UA: 0.2 E.U./dL
pH, UA: 6 (ref 5.0–8.0)

## 2017-11-08 LAB — LIPID PANEL
Cholesterol: 155 mg/dL (ref 0–200)
HDL: 28.6 mg/dL — ABNORMAL LOW (ref >39.00)
NonHDL: 126.35
Total CHOL/HDL Ratio: 5
Triglycerides: 244 mg/dL — ABNORMAL HIGH (ref 0.0–149.0)
VLDL: 48.8 mg/dL — ABNORMAL HIGH (ref 0.0–40.0)

## 2017-11-08 LAB — CBC WITH DIFFERENTIAL/PLATELET
Basophils Absolute: 0 10*3/uL (ref 0.0–0.1)
Basophils Relative: 0.4 % (ref 0.0–3.0)
Eosinophils Absolute: 0.1 10*3/uL (ref 0.0–0.7)
Eosinophils Relative: 1.1 % (ref 0.0–5.0)
HCT: 39.7 % (ref 36.0–46.0)
Hemoglobin: 12.7 g/dL (ref 12.0–15.0)
Lymphocytes Relative: 64.7 % — ABNORMAL HIGH (ref 12.0–46.0)
Lymphs Abs: 4.5 10*3/uL — ABNORMAL HIGH (ref 0.7–4.0)
MCHC: 32 g/dL (ref 30.0–36.0)
MCV: 79.5 fl (ref 78.0–100.0)
Monocytes Absolute: 0.5 10*3/uL (ref 0.1–1.0)
Monocytes Relative: 7.5 % (ref 3.0–12.0)
Neutro Abs: 1.8 10*3/uL (ref 1.4–7.7)
Neutrophils Relative %: 26.3 % — ABNORMAL LOW (ref 43.0–77.0)
Platelets: 274 10*3/uL (ref 150.0–400.0)
RBC: 5 Mil/uL (ref 3.87–5.11)
RDW: 20.6 % — ABNORMAL HIGH (ref 11.5–15.5)
WBC: 6.9 10*3/uL (ref 4.0–10.5)

## 2017-11-08 LAB — BASIC METABOLIC PANEL
BUN: 11 mg/dL (ref 6–23)
CO2: 26 mEq/L (ref 19–32)
Calcium: 8.2 mg/dL — ABNORMAL LOW (ref 8.4–10.5)
Chloride: 104 mEq/L (ref 96–112)
Creatinine, Ser: 0.84 mg/dL (ref 0.40–1.20)
GFR: 78.01 mL/min (ref >60.00)
Glucose, Bld: 101 mg/dL — ABNORMAL HIGH (ref 70–99)
Potassium: 4.1 mEq/L (ref 3.5–5.1)
Sodium: 136 mEq/L (ref 135–145)

## 2017-11-08 LAB — HEPATIC FUNCTION PANEL
ALT: 25 U/L (ref 0–35)
AST: 26 U/L (ref 0–37)
Albumin: 3.3 g/dL — ABNORMAL LOW (ref 3.5–5.2)
Alkaline Phosphatase: 150 U/L — ABNORMAL HIGH (ref 39–117)
Bilirubin, Direct: 0.1 mg/dL (ref 0.0–0.3)
Total Bilirubin: 0.6 mg/dL (ref 0.2–1.2)
Total Protein: 5.9 g/dL — ABNORMAL LOW (ref 6.0–8.3)

## 2017-11-08 LAB — LDL CHOLESTEROL, DIRECT: Direct LDL: 81 mg/dL

## 2017-11-08 LAB — H. PYLORI ANTIBODY, IGG: H Pylori IgG: NEGATIVE

## 2017-11-08 LAB — POCT URINE PREGNANCY: Preg Test, Ur: NEGATIVE

## 2017-11-08 LAB — TSH: TSH: 1.95 u[IU]/mL (ref 0.35–4.50)

## 2017-11-08 MED ORDER — METFORMIN HCL ER 750 MG PO TB24: 750.0000 mg | ORAL_TABLET | Freq: Two times a day (BID) | ORAL | 3 refills | Status: DC

## 2017-11-08 MED ORDER — OMEPRAZOLE 20 MG PO CPDR: 20.0000 mg | DELAYED_RELEASE_CAPSULE | Freq: Every day | ORAL | 3 refills | Status: DC

## 2017-11-08 NOTE — Progress Notes (Signed)
   Subjective:    Patient ID: Carol Houston, female    DOB: 1970-03-30, 47 y.o.   MRN: [id number]  HPI Nausea- 'i'm nauseous all the time'.  sxs started ~2 weeks ago.  sxs started during menses.   No recent dietary changes.  Denies GERD, indigestion.  Denies increased stress recently.  No new medications or supplements.  No vomiting.  Decreased appetite, 'I have to make myself eat'.  Denies night sweats, fatigue, changes to skin/hair/nails.  No CP, SOB.  'like a miserable, nervous, nauseous feeling'.  Obesity- in May, pt started '[employer]' and started a low carb diet.  Went from 267 --> 225.  In the last 2 weeks, she is now up to 242.  Pt reports clothes still fit the same.  PCOS- Dr Valentino Saxon is treating her for this but needs a refill on Metformin prior to December visit.   Review of Systems For ROS see HPI     Objective:   Physical Exam  Constitutional: She is oriented to person, place, and time. She appears well-developed and well-nourished. No distress.  HENT:  Head: Normocephalic and atraumatic.  MMM  Neck: Neck supple.  Cardiovascular: Normal rate, regular rhythm and intact distal pulses.  Pulmonary/Chest: Effort normal and breath sounds normal. No respiratory distress. She has no wheezes. She has no rales.  Abdominal: Soft. Bowel sounds are normal. She exhibits no distension. There is no tenderness. There is no rebound.  Lymphadenopathy:    She has no cervical adenopathy.  Neurological: She is alert and oriented to person, place, and time.  Skin: Skin is warm and dry.          Assessment & Plan:  Nausea- new.  Pt reports 2 weeks of nausea.  No other sxs.  Admits to poor appetite and erratic eating.  Discussed need for regular eating and possibility of increased acid production.  Start daily PPI and wait for labs to determine if additional work up is needed.  Pt expressed understanding and is in agreement w/ plan.

## 2017-11-08 NOTE — Patient Instructions (Signed)
Follow up by phone or MyChart in 2 weeks to let me know how you're doing We'll notify you of your lab results and make any changes if needed START the Omeprazole once daily to decrease acid production Continue to drink plenty of fluids Try grazing throughout the day rather than meals Call with any questions or concerns Hang in there!!!

## 2017-11-09 ENCOUNTER — Other Ambulatory Visit (INDEPENDENT_AMBULATORY_CARE_PROVIDER_SITE_OTHER): Payer: BLUE CROSS/BLUE SHIELD

## 2017-11-09 ENCOUNTER — Encounter: Payer: Self-pay | Admitting: Family Medicine

## 2017-11-09 DIAGNOSIS — R748 Abnormal levels of other serum enzymes: Secondary | ICD-10-CM | POA: Diagnosis not present

## 2017-11-09 DIAGNOSIS — R140 Abdominal distension (gaseous): Secondary | ICD-10-CM | POA: Diagnosis not present

## 2017-11-09 DIAGNOSIS — R6881 Early satiety: Secondary | ICD-10-CM | POA: Diagnosis not present

## 2017-11-09 DIAGNOSIS — R110 Nausea: Secondary | ICD-10-CM | POA: Diagnosis not present

## 2017-11-09 LAB — GAMMA GT: GGT: 48 U/L (ref 7–51)

## 2017-11-09 LAB — VITAMIN D 25 HYDROXY (VIT D DEFICIENCY, FRACTURES): VITD: 15.19 ng/mL — ABNORMAL LOW (ref 30.00–100.00)

## 2017-11-10 ENCOUNTER — Encounter: Payer: Self-pay | Admitting: Family Medicine

## 2017-11-10 ENCOUNTER — Other Ambulatory Visit: Payer: Self-pay | Admitting: General Practice

## 2017-11-10 LAB — CA 125: CA 125: 21 U/mL (ref <35)

## 2017-11-10 MED ORDER — VITAMIN D (ERGOCALCIFEROL) 1.25 MG (50000 UNIT) PO CAPS: 50000.0000 [IU] | ORAL_CAPSULE | ORAL | 0 refills | Status: DC

## 2017-11-10 MED ORDER — FUROSEMIDE 20 MG PO TABS: 20.0000 mg | ORAL_TABLET | Freq: Every day | ORAL | 3 refills | Status: DC

## 2017-11-12 NOTE — Assessment & Plan Note (Signed)
Ongoing issue for pt.  She has been taking Metformin per GYN but needs a refill prior to next appt.  Prescription filled.

## 2017-11-12 NOTE — Assessment & Plan Note (Signed)
Ongoing issue for pt.  She reports she lost considerable weight but in the last 2 weeks has regained 17 lbs.  Pregnancy test is (-).  No obvious cause of recent rapid weight gain.  Check labs.  Will follow.

## 2017-11-15 ENCOUNTER — Other Ambulatory Visit: Payer: Self-pay

## 2017-11-15 ENCOUNTER — Encounter: Payer: Self-pay | Admitting: Family Medicine

## 2017-11-15 ENCOUNTER — Ambulatory Visit: Payer: BLUE CROSS/BLUE SHIELD | Admitting: Family Medicine

## 2017-11-15 VITALS — BP 121/89 | HR 102 | Temp 98.7°F | Resp 16 | Ht 66.0 in | Wt 237.0 lb

## 2017-11-15 DIAGNOSIS — R600 Localized edema: Secondary | ICD-10-CM

## 2017-11-15 DIAGNOSIS — R110 Nausea: Secondary | ICD-10-CM

## 2017-11-15 LAB — BASIC METABOLIC PANEL
BUN: 11 mg/dL (ref 6–23)
CO2: 26 mEq/L (ref 19–32)
Calcium: 8.9 mg/dL (ref 8.4–10.5)
Chloride: 104 mEq/L (ref 96–112)
Creatinine, Ser: 1.04 mg/dL (ref 0.40–1.20)
GFR: 60.97 mL/min (ref >60.00)
Glucose, Bld: 97 mg/dL (ref 70–99)
Potassium: 4.3 mEq/L (ref 3.5–5.1)
Sodium: 138 mEq/L (ref 135–145)

## 2017-11-15 NOTE — Progress Notes (Signed)
   Subjective:    Patient ID: Carol Houston, female    DOB: 12-12-1970, 47 y.o.   MRN: [id number]  HPI Edema- 'I feel the best today that i've felt in about 3 weeks'.  Pt is wearing compression socks, no swelling today.  Is down 5 lbs since last visit.  After wearing the compression hose yesterday, pt started diuresing.  Nausea- completely resolved day after last appt (9/26).   Review of Systems For ROS see HPI     Objective:   Physical Exam  Constitutional: She is oriented to person, place, and time. She appears well-developed and well-nourished. No distress.  HENT:  Head: Normocephalic and atraumatic.  Eyes: Pupils are equal, round, and reactive to light. Conjunctivae and EOM are normal.  Neck: Normal range of motion. Neck supple. No thyromegaly present.  Cardiovascular: Normal rate, regular rhythm, normal heart sounds and intact distal pulses.  No murmur heard. Pulmonary/Chest: Effort normal and breath sounds normal. No respiratory distress.  Abdominal: Soft. She exhibits no distension. There is no tenderness.  Musculoskeletal: She exhibits no edema.  Lymphadenopathy:    She has no cervical adenopathy.  Neurological: She is alert and oriented to person, place, and time.  Skin: Skin is warm and dry.  Psychiatric: She has a normal mood and affect. Her behavior is normal.  Vitals reviewed.         Assessment & Plan:  Edema- resolved w/ addition of Lasix and compression socks.  Completely asymptomatic today.  Check BMP to assess K and Cr  Nausea- resolved after starting Omeprazole 20mg

## 2017-11-15 NOTE — Patient Instructions (Signed)
Follow up as needed or as scheduled We'll notify you of your lab results and make any changes if needed Take the Furosemide as needed for swelling Continue to wear the compression sock for work (if possible) Call with any questions or concerns [place]!!

## 2017-11-16 ENCOUNTER — Encounter: Payer: Self-pay | Admitting: General Practice

## 2017-12-14 DIAGNOSIS — Z01419 Encounter for gynecological examination (general) (routine) without abnormal findings: Secondary | ICD-10-CM | POA: Diagnosis not present

## 2017-12-14 DIAGNOSIS — Z6837 Body mass index (BMI) 37.0-37.9, adult: Secondary | ICD-10-CM | POA: Diagnosis not present

## 2017-12-14 DIAGNOSIS — Z1231 Encounter for screening mammogram for malignant neoplasm of breast: Secondary | ICD-10-CM | POA: Diagnosis not present

## 2017-12-14 LAB — HM MAMMOGRAPHY

## 2017-12-22 ENCOUNTER — Encounter: Payer: Self-pay | Admitting: General Practice

## 2018-01-25 ENCOUNTER — Other Ambulatory Visit: Payer: Self-pay | Admitting: Family Medicine

## 2018-03-12 ENCOUNTER — Other Ambulatory Visit: Payer: Self-pay | Admitting: Family Medicine

## 2018-03-14 ENCOUNTER — Other Ambulatory Visit: Payer: Self-pay | Admitting: Family Medicine

## 2018-05-01 ENCOUNTER — Telehealth: Payer: BLUE CROSS/BLUE SHIELD | Admitting: Physician Assistant

## 2018-05-01 DIAGNOSIS — J00 Acute nasopharyngitis [common cold]: Secondary | ICD-10-CM

## 2018-05-01 MED ORDER — IPRATROPIUM BROMIDE 0.06 % NA SOLN: 2.0000 | Freq: Four times a day (QID) | NASAL | 0 refills | Status: DC

## 2018-05-01 MED ORDER — BENZONATATE 100 MG PO CAPS: 100.0000 mg | ORAL_CAPSULE | Freq: Three times a day (TID) | ORAL | 0 refills | Status: DC

## 2018-05-01 NOTE — Progress Notes (Signed)
We are sorry you are not feeling well.Here is how we plan to help!  Based on what you have shared with me, it looks like you may have a viral upper respiratory infection, not COVID-19, patient her symptoms are not consistent with that diagnosis.Upper respiratory infections are caused by a large number of viruses; however, rhinovirus is the most common cause.   Symptoms vary from person to person, with common symptoms including sore throat, cough, and fatigue or lack of energy.A low-grade fever of up to 100.4 may present, but is often uncommon.Symptoms vary however, and are closely related to a person's age or underlying illnesses.The most common symptoms associated with an upper respiratory infection are nasal discharge or congestion, cough, sneezing, headache and pressure in the ears and face.These symptoms usually persist for about 3 to 10 days, but can last up to 2 weeks.It is important to know that upper respiratory infections do not cause serious illness or complications in most cases.  Upper respiratory infections can be transmitted from person to person, with the most common method of transmission being a person's hands.The virus is able to live on the skin and can infect other persons for up to 2 hours after direct contact.Also, these can be transmitted when someone coughs or sneezes; thus, it is important to cover the mouth to reduce this risk.To keep the spread of the illness at [place], good hand hygiene is very important.  This is an infection that is most likely caused by a virus. There are no specific treatments other than to help you with the symptoms until the infection runs its course.We are sorry you are not feeling well.Here is how we plan to help!   For nasal congestion, you may use an oral decongestants such as Mucinex D or if you have glaucoma or high blood pressure use plain Mucinex.Saline nasal spray or nasal drops can help and can safely be used as often as  needed for congestion.For your congestion, I have prescribed Ipratropium Bromide nasal spray 0.03% two sprays in each nostril 2-3 times a day  If you do not have a history of heart disease, hypertension, diabetes or thyroid disease, prostate/bladder issues or glaucoma, you may also use Sudafed to treat nasal congestion.It is highly recommended that you consult with a pharmacist or your primary care physician to ensure this medication is safe for you to take.   If you have a cough, you may use cough suppressants such as Delsym and Robitussin.If you have glaucoma or high blood pressure, you can also use Coricidin HBP. For cough I have prescribed for you A prescription cough medication called Tessalon Perles 100 mg. You may take 1-2 capsules every 8 hours as needed for cough  If you have a sore or scratchy throat, use a saltwater gargle-  to  teaspoon of salt dissolved in a 4-ounce to 8-ounce glass of warm water.Gargle the solution for approximately 15-30 seconds and then spit.It is important not to swallow the solution.You can also use throat lozenges/cough drops and Chloraseptic spray to help with throat pain or discomfort.Warm or cold liquids can also be helpful in relieving throat pain.  For headache, pain or general discomfort, you can use Ibuprofen or Tylenol as directed. Some authorities believe that zinc sprays or the use of Echinacea may shorten the course of your symptoms.   HOME CARE Only take medications as instructed by your medical team. Be sure to drink plenty of fluids. Water is fine as well as fruit juices, sodas and  electrolyte beverages. You may want to stay away from caffeine or alcohol. If you are nauseated, try taking small sips of liquids. How do you know if you are getting enough fluid? Your urine should be a pale yellow or almost colorless. Get rest. Taking a steamy shower or using a humidifier may help nasal congestion and ease sore throat pain. You can  place a towel over your head and breathe in the steam from hot water coming from a faucet. Using a saline nasal spray works much the same way. Cough drops, hard candies and sore throat lozenges may ease your cough. Avoid close contacts especially the very young and the elderly Cover your mouth if you cough or sneeze Always remember to wash your hands.   GET HELP RIGHT AWAY IF: You develop worsening fever. If your symptoms do not improve within 10 days You develop yellow or green discharge from your nose over 3 days. You have coughing fits You develop a severe head ache or visual changes. You develop shortness of breath, difficulty breathing or start having chest pain  Your symptoms persist after you have completed your treatment plan  MAKE SURE YOU  Understand these instructions. Will watch your condition. Will get help right away if you are not doing well or get worse.  Your e-visit answers were reviewed by a board certified advanced clinical practitioner to complete your personal care plan. Depending upon the condition, your plan could have included both over the counter or prescription medications. Please review your pharmacy choice. If there is a problem, you may call our nursing hot line at and have the prescription routed to another pharmacy. Your safety is important to [place]. If you have drug allergies check your prescription carefully.   You can use MyChart to ask questions about today's visit, request a non-urgent call back, or ask for a work or school excuse for 24 hours related to this e-Visit. If it has been greater than 24 hours you will need to follow up with your provider, or enter a new e-Visit to address those concerns. You will get an e-mail in the next two days asking about your experience.I hope that your e-visit has been valuable and will speed your recovery. Thank you for using e-visits.  A total of 5-10 minutes was spent evaluating this patients questionnaire and  formulating a plan of care.

## 2018-05-03 ENCOUNTER — Telehealth: Payer: BLUE CROSS/BLUE SHIELD | Admitting: Physician Assistant

## 2018-05-03 DIAGNOSIS — B9689 Other specified bacterial agents as the cause of diseases classified elsewhere: Secondary | ICD-10-CM

## 2018-05-03 DIAGNOSIS — J208 Acute bronchitis due to other specified organisms: Principal | ICD-10-CM

## 2018-05-03 MED ORDER — DOXYCYCLINE HYCLATE 100 MG PO CAPS: 100.0000 mg | ORAL_CAPSULE | Freq: Two times a day (BID) | ORAL | 0 refills | Status: DC

## 2018-05-03 NOTE — Progress Notes (Signed)
Message sent to patient with coronavirus questionnaire just to r/o high risk. Symptoms seem consistent with bacterial URI but just being cautious.   Awaiting patient response.

## 2018-05-03 NOTE — Progress Notes (Signed)
We are sorry that you are not feeling well.  Here is how we plan to help!  Based on your presentation I believe you most likely have A cough due to bacteria.  When patients have a fever and a productive cough with a change in color or increased sputum production, we are concerned about bacterial bronchitis.  If left untreated it can progress to pneumonia.  If your symptoms do not improve with your treatment plan it is important that you contact your provider.   I have prescribed Doxycycline 100 mg twice a day for 7 days     Continue use of the tessalon perles that were already sent in for you.  From your responses in the eVisit questionnaire you describe inflammation in the upper respiratory tract which is causing a significant cough.  This is commonly called Bronchitis and has four common causes:    Allergies  Viral Infections  Acid Reflux  Bacterial Infection Allergies, viruses and acid reflux are treated by controlling symptoms or eliminating the cause. An example might be a cough caused by taking certain blood pressure medications. You stop the cough by changing the medication. Another example might be a cough caused by acid reflux. Controlling the reflux helps control the cough.  USE OF BRONCHODILATOR ("RESCUE") INHALERS: There is a risk from using your bronchodilator too frequently.  The risk is that over-reliance on a medication which only relaxes the muscles surrounding the breathing tubes can reduce the effectiveness of medications prescribed to reduce swelling and congestion of the tubes themselves.  Although you feel brief relief from the bronchodilator inhaler, your asthma may actually be worsening with the tubes becoming more swollen and filled with mucus.  This can delay other crucial treatments, such as oral steroid medications. If you need to use a bronchodilator inhaler daily, several times per day, you should discuss this with your provider.  There are probably better treatments  that could be used to keep your asthma under control.     HOME CARE . Only take medications as instructed by your medical team. . Complete the entire course of an antibiotic. . Drink plenty of fluids and get plenty of rest. . Avoid close contacts especially the very young and the elderly . Cover your mouth if you cough or cough into your sleeve. . Always remember to wash your hands . A steam or ultrasonic humidifier can help congestion.   GET HELP RIGHT AWAY IF: . You develop worsening fever. . You become short of breath . You cough up blood. . Your symptoms persist after you have completed your treatment plan MAKE SURE YOU   Understand these instructions.  Will watch your condition.  Will get help right away if you are not doing well or get worse.  Your e-visit answers were reviewed by a board certified advanced clinical practitioner to complete your personal care plan.  Depending on the condition, your plan could have included both over the counter or prescription medications. If there is a problem please reply  once you have received a response from your provider. Your safety is important to [place].  If you have drug allergies check your prescription carefully.    You can use MyChart to ask questions about today's visit, request a non-urgent call back, or ask for a work or school excuse for 24 hours related to this e-Visit. If it has been greater than 24 hours you will need to follow up with your provider, or enter a new e-Visit to  address those concerns. You will get an e-mail in the next two days asking about your experience.  I hope that your e-visit has been valuable and will speed your recovery. Thank you for using e-visits.

## 2018-05-05 ENCOUNTER — Other Ambulatory Visit: Payer: Self-pay | Admitting: Physician Assistant

## 2018-05-08 ENCOUNTER — Other Ambulatory Visit: Payer: Self-pay | Admitting: Physician Assistant

## 2018-09-11 ENCOUNTER — Other Ambulatory Visit: Payer: Self-pay | Admitting: Gerontology

## 2018-09-11 ENCOUNTER — Ambulatory Visit (INDEPENDENT_AMBULATORY_CARE_PROVIDER_SITE_OTHER): Payer: Self-pay

## 2018-09-11 ENCOUNTER — Other Ambulatory Visit: Payer: Self-pay

## 2018-09-11 DIAGNOSIS — R0602 Shortness of breath: Secondary | ICD-10-CM

## 2018-09-11 DIAGNOSIS — R0789 Other chest pain: Secondary | ICD-10-CM

## 2018-09-30 DIAGNOSIS — J101 Influenza due to other identified influenza virus with other respiratory manifestations: Secondary | ICD-10-CM | POA: Diagnosis not present

## 2019-01-01 DIAGNOSIS — R05 Cough: Secondary | ICD-10-CM | POA: Diagnosis not present

## 2019-01-07 DIAGNOSIS — R05 Cough: Secondary | ICD-10-CM | POA: Diagnosis not present

## 2019-01-07 DIAGNOSIS — R9389 Abnormal findings on diagnostic imaging of other specified body structures: Secondary | ICD-10-CM | POA: Diagnosis not present

## 2019-01-07 DIAGNOSIS — R918 Other nonspecific abnormal finding of lung field: Secondary | ICD-10-CM | POA: Diagnosis not present

## 2019-01-09 ENCOUNTER — Encounter: Payer: Self-pay | Admitting: Family Medicine

## 2019-01-09 NOTE — Telephone Encounter (Signed)
Since she was not seen here she would need to call them for inhaler or schedule VV. If she is not having SOB or significant chest tightness/wheezing then no inhaler would be indicated. She should continue care as directed by the treating provider.

## 2019-01-13 ENCOUNTER — Encounter: Payer: Self-pay | Admitting: Family Medicine

## 2019-01-14 ENCOUNTER — Ambulatory Visit (INDEPENDENT_AMBULATORY_CARE_PROVIDER_SITE_OTHER): Payer: BC Managed Care – PPO | Admitting: Family Medicine

## 2019-01-14 ENCOUNTER — Other Ambulatory Visit: Payer: Self-pay

## 2019-01-14 ENCOUNTER — Encounter: Payer: Self-pay | Admitting: Family Medicine

## 2019-01-14 DIAGNOSIS — J189 Pneumonia, unspecified organism: Secondary | ICD-10-CM | POA: Diagnosis not present

## 2019-01-14 MED ORDER — DOXYCYCLINE HYCLATE 100 MG PO TABS: 100.0000 mg | ORAL_TABLET | Freq: Two times a day (BID) | ORAL | 0 refills | Status: DC

## 2019-01-14 MED ORDER — ALBUTEROL SULFATE HFA 108 (90 BASE) MCG/ACT IN AERS: 2.0000 | INHALATION_SPRAY | Freq: Four times a day (QID) | RESPIRATORY_TRACT | 1 refills | Status: DC | PRN

## 2019-01-14 MED ORDER — PROMETHAZINE-DM 6.25-15 MG/5ML PO SYRP: 5.0000 mL | ORAL_SOLUTION | Freq: Four times a day (QID) | ORAL | 0 refills | Status: DC | PRN

## 2019-01-14 NOTE — Progress Notes (Signed)
   Virtual Visit via Video   I connected with patient on 01/14/19 at  3:30 PM EST by a video enabled telemedicine application and verified that I am speaking with the correct person using two identifiers.  Location patient: Home Location provider: Acupuncturist, Office Persons participating in the virtual visit: Patient, Provider, [place] (Jess B)  I discussed the limitations of evaluation and management by telemedicine and the availability of in person appointments. The patient expressed understanding and agreed to proceed.  Subjective:   HPI:   PNA- pt had CT done on 11/23 and was dx'd w/ LLL pna.  Pt had a cough 'for about a month' before going to UC.  She had an x[facility] at [facility] that was inconclusive and that prompted a f/u CT scan.  Was started on Zpack- finished yesterday.  Reports not feeling any better.  Continues to have excessive fatigue, cough, chest heaviness.  Did not have COVID test.  Denies fever, chills.  Denies body aches.  No change to taste or smell.  No known sick contacts.  Some wheezing at night.  + SOB w/ exertion.  Pt felt she was getting better for a day or so but sxs again worsened yesterday.  Cough is now productive- was not previously.  ROS:   See pertinent positives and negatives per HPI.  Patient Active Problem List   Diagnosis Date Noted  . PCOS (polycystic ovarian syndrome) 11/08/2017  . Physical exam 05/15/2015  . Obesity (BMI 35.0-39.9 without comorbidity) 05/15/2015  . HEADACHE 10/27/2008  . HYPERTENSION 08/05/2008    Social History   Tobacco Use  . Smoking status: Never Smoker  . Smokeless tobacco: Never Used  Substance Use Topics  . Alcohol use: Yes    Comment: social    Current Outpatient Medications:  .  metFORMIN (GLUCOPHAGE) 500 MG tablet, Take 500 mg by mouth 2 (two) times daily., Disp: , Rfl:  .  Multiple Vitamins-Minerals (MULTIVITAMIN ADULTS PO), Take by mouth., Disp: , Rfl:  .  promethazine-dextromethorphan (PROMETHAZINE-DM) 6.25-15  MG/5ML syrup, Take by mouth 4 (four) times daily as needed for cough., Disp: , Rfl:  .  aspirin 81 MG chewable tablet, Chew 81 mg by mouth daily. Reported on 05/15/2015, Disp: , Rfl:  .  omeprazole (PRILOSEC) 20 MG capsule, TAKE 1 CAPSULE BY MOUTH EVERY DAY (Patient not taking: Reported on 01/14/2019), Disp: 90 capsule, Rfl: 1  Allergies  Allergen Reactions  . Codeine Hives and Itching    Objective:   There were no vitals taken for this visit. AAOx3, NAD NCAT, EOMI No obvious CN deficits Coloring WNL Pt is able to speak clearly, coherently without shortness of breath or increased work of breathing.  Thought process is linear.  Mood is appropriate.   Assessment and Plan:   LLL PNA- new.  Pt had CXR and subsequent CT that confirmed LLL PNA.  She has not been COVID tested.  No improvement w/ Zpack.  Continues to have chest tightness and SOB.  Will switch to Doxy and add Albuterol.  If no improvement by late week or at anytime worsening, pt is to let me know.  Discussed that a viral PNA will not improve w/ abx and a COVID test may be needed.  Pt expressed understanding and is in agreement w/ plan.    Annye Asa, MD 01/14/2019

## 2019-01-14 NOTE — Progress Notes (Signed)
I have discussed the procedure for the virtual visit with the patient who has given consent to proceed with assessment and treatment.   Pt unable to obtain vitals.   Jessica L Brodmerkel, CMA     

## 2019-01-15 ENCOUNTER — Encounter: Payer: Self-pay | Admitting: Family Medicine

## 2019-01-15 MED ORDER — BENZONATATE 200 MG PO CAPS: 200.0000 mg | ORAL_CAPSULE | Freq: Two times a day (BID) | ORAL | 0 refills | Status: DC | PRN

## 2019-01-17 ENCOUNTER — Other Ambulatory Visit: Payer: Self-pay

## 2019-01-17 ENCOUNTER — Encounter: Payer: Self-pay | Admitting: Family Medicine

## 2019-01-17 DIAGNOSIS — Z20822 Contact with and (suspected) exposure to covid-19: Secondary | ICD-10-CM

## 2019-01-19 LAB — NOVEL CORONAVIRUS, NAA: SARS-CoV-2, NAA: NOT DETECTED

## 2019-01-22 ENCOUNTER — Encounter: Payer: Self-pay | Admitting: Family Medicine

## 2019-01-30 DIAGNOSIS — R0981 Nasal congestion: Secondary | ICD-10-CM | POA: Diagnosis not present

## 2019-01-30 DIAGNOSIS — R05 Cough: Secondary | ICD-10-CM | POA: Diagnosis not present

## 2019-01-30 DIAGNOSIS — Z20828 Contact with and (suspected) exposure to other viral communicable diseases: Secondary | ICD-10-CM | POA: Diagnosis not present

## 2019-03-15 ENCOUNTER — Encounter: Payer: Self-pay | Admitting: Family Medicine

## 2019-03-15 ENCOUNTER — Other Ambulatory Visit: Payer: Self-pay

## 2019-03-15 ENCOUNTER — Ambulatory Visit (INDEPENDENT_AMBULATORY_CARE_PROVIDER_SITE_OTHER): Payer: BC Managed Care – PPO | Admitting: Family Medicine

## 2019-03-15 VITALS — Temp 98.4°F

## 2019-03-15 DIAGNOSIS — Z20822 Contact with and (suspected) exposure to covid-19: Secondary | ICD-10-CM | POA: Diagnosis not present

## 2019-03-15 DIAGNOSIS — R519 Headache, unspecified: Secondary | ICD-10-CM

## 2019-03-15 DIAGNOSIS — R070 Pain in throat: Secondary | ICD-10-CM | POA: Diagnosis not present

## 2019-03-15 DIAGNOSIS — J029 Acute pharyngitis, unspecified: Secondary | ICD-10-CM | POA: Diagnosis not present

## 2019-03-15 MED ORDER — METFORMIN HCL 500 MG PO TABS: 500.0000 mg | ORAL_TABLET | Freq: Two times a day (BID) | ORAL | 1 refills | Status: DC

## 2019-03-15 NOTE — Progress Notes (Signed)
   Virtual Visit via Video   I connected with patient on 03/15/19 at 11:00 AM EST by a video enabled telemedicine application and verified that I am speaking with the correct person using two identifiers.  Location patient: Home Location provider: Acupuncturist, Office Persons participating in the virtual visit: Patient, Provider, [place] (Jess B)  I discussed the limitations of evaluation and management by telemedicine and the availability of in person appointments. The patient expressed understanding and agreed to proceed.  Subjective:   HPI:   COVID- was exposed on Monday.  + HA 'that won't go away' and 'really bad sore throat' w/ sinus pressure.  No fever, no change to taste or smell.  No body aches.  Exposure was a close contact.  Has appt for testing today at 4pm- rapid test in [place].  Drinking lots of fluids.  Taking Vit C, Vit D, and Zinc.  ROS:   See pertinent positives and negatives per HPI.  Patient Active Problem List   Diagnosis Date Noted  . PCOS (polycystic ovarian syndrome) 11/08/2017  . Physical exam 05/15/2015  . Obesity (BMI 35.0-39.9 without comorbidity) 05/15/2015  . HEADACHE 10/27/2008  . HYPERTENSION 08/05/2008    Social History   Tobacco Use  . Smoking status: Never Smoker  . Smokeless tobacco: Never Used  Substance Use Topics  . Alcohol use: Yes    Comment: social    Current Outpatient Medications:  .  aspirin 81 MG chewable tablet, Chew 81 mg by mouth daily. Reported on 05/15/2015, Disp: , Rfl:  .  metFORMIN (GLUCOPHAGE) 500 MG tablet, Take 500 mg by mouth 2 (two) times daily., Disp: , Rfl:  .  Multiple Vitamins-Minerals (MULTIVITAMIN ADULTS PO), Take by mouth., Disp: , Rfl:  .  albuterol (VENTOLIN HFA) 108 (90 Base) MCG/ACT inhaler, Inhale 2 puffs into the lungs every 6 (six) hours as needed for wheezing or shortness of breath. (Patient not taking: Reported on 03/15/2019), Disp: 18 g, Rfl: 1 .  omeprazole (PRILOSEC) 20 MG capsule, TAKE 1  CAPSULE BY MOUTH EVERY DAY (Patient not taking: Reported on 01/14/2019), Disp: 90 capsule, Rfl: 1  Allergies  Allergen Reactions  . Codeine Hives and Itching    Objective:   Temp 98.4 F (36.9 C) (Oral)   AAOx3, NAD NCAT, EOMI No obvious CN deficits Coloring WNL Pt is able to speak clearly, coherently without shortness of breath or increased work of breathing.  Thought process is linear.  Mood is appropriate.   Assessment and Plan:   Suspected COVID- pt has testing scheduled for this afternoon but encouraged her to get a regular PCR test if the rapid was (-).  Reviewed need for Vit D, Vit C, and Zinc.  Encouraged fluids and rest.  Will enroll in home monitoring program.  Pt expressed understanding and is in agreement w/ plan.    Annye Asa, MD 03/15/2019

## 2019-03-15 NOTE — Progress Notes (Signed)
I have discussed the procedure for the virtual visit with the patient who has given consent to proceed with assessment and treatment.   Pt had a positive exposure at work to [employer], on Monday of this week. Pt has been having a HA and sore throat.   Davis Gourd, CMA

## 2019-03-16 ENCOUNTER — Encounter: Payer: Self-pay | Admitting: Family Medicine

## 2019-03-17 ENCOUNTER — Encounter (INDEPENDENT_AMBULATORY_CARE_PROVIDER_SITE_OTHER): Payer: Self-pay

## 2019-03-18 ENCOUNTER — Encounter (INDEPENDENT_AMBULATORY_CARE_PROVIDER_SITE_OTHER): Payer: Self-pay

## 2019-03-19 ENCOUNTER — Encounter (INDEPENDENT_AMBULATORY_CARE_PROVIDER_SITE_OTHER): Payer: Self-pay

## 2019-03-20 ENCOUNTER — Encounter (INDEPENDENT_AMBULATORY_CARE_PROVIDER_SITE_OTHER): Payer: Self-pay

## 2019-03-22 ENCOUNTER — Encounter (INDEPENDENT_AMBULATORY_CARE_PROVIDER_SITE_OTHER): Payer: Self-pay

## 2019-03-23 ENCOUNTER — Encounter (INDEPENDENT_AMBULATORY_CARE_PROVIDER_SITE_OTHER): Payer: Self-pay

## 2019-03-25 ENCOUNTER — Encounter (INDEPENDENT_AMBULATORY_CARE_PROVIDER_SITE_OTHER): Payer: Self-pay

## 2019-03-26 ENCOUNTER — Encounter (INDEPENDENT_AMBULATORY_CARE_PROVIDER_SITE_OTHER): Payer: Self-pay

## 2019-03-27 ENCOUNTER — Encounter (INDEPENDENT_AMBULATORY_CARE_PROVIDER_SITE_OTHER): Payer: Self-pay

## 2019-05-13 ENCOUNTER — Encounter: Payer: Self-pay | Admitting: Physician Assistant

## 2019-05-13 ENCOUNTER — Other Ambulatory Visit: Payer: Self-pay

## 2019-05-13 ENCOUNTER — Telehealth (INDEPENDENT_AMBULATORY_CARE_PROVIDER_SITE_OTHER): Payer: BC Managed Care – PPO | Admitting: Physician Assistant

## 2019-05-13 VITALS — Temp 97.5°F

## 2019-05-13 DIAGNOSIS — K219 Gastro-esophageal reflux disease without esophagitis: Secondary | ICD-10-CM

## 2019-05-13 MED ORDER — PANTOPRAZOLE SODIUM 20 MG PO TBEC: 20.0000 mg | DELAYED_RELEASE_TABLET | Freq: Every day | ORAL | 1 refills | Status: DC

## 2019-05-13 NOTE — Progress Notes (Signed)
I have discussed the procedure for the virtual visit with the patient who has given consent to proceed with assessment and treatment.   Nafis Farnan S Tyan Lasure, CMA     

## 2019-05-13 NOTE — Progress Notes (Signed)
   Virtual Visit via Video   I connected with patient on 05/13/19 at  2:30 PM EDT by a video enabled telemedicine application and verified that I am speaking with the correct person using two identifiers.  Location patient: Home Location provider: [facility] Office Persons participating in the virtual visit: Patient, Provider, [place] (Patina Moore)  I discussed the limitations of evaluation and management by telemedicine and the availability of in person appointments. The patient expressed understanding and agreed to proceed.  Subjective:   HPI:   Patient presents via [facility] today c/o 2 weeks of indigestion/heart burn with epigastric pain and some early satiety. Denies nausea, vomiting, melena, hematochezia or tenesmus. Denies any recent change in medication, NSAID use. Does not drink alcohol. Prior history of GERD, short-lived, without any history of ulcer. Denies any late-night eating. Denies any recent change in diet. Does have poor diet -- fried foods, spicy foods. Does note significant increase in stress -- new position at work with more responsibilities. Has taken TUMS with some relief.   ROS:   See pertinent positives and negatives per HPI.  Patient Active Problem List   Diagnosis Date Noted  . PCOS (polycystic ovarian syndrome) 11/08/2017  . Physical exam 05/15/2015  . Obesity (BMI 35.0-39.9 without comorbidity) 05/15/2015  . HEADACHE 10/27/2008  . HYPERTENSION 08/05/2008    Social History   Tobacco Use  . Smoking status: Never Smoker  . Smokeless tobacco: Never Used  Substance Use Topics  . Alcohol use: Yes    Comment: social    Current Outpatient Medications:  .  albuterol (VENTOLIN HFA) 108 (90 Base) MCG/ACT inhaler, Inhale 2 puffs into the lungs every 6 (six) hours as needed for wheezing or shortness of breath. (Patient not taking: Reported on 03/15/2019), Disp: 18 g, Rfl: 1 .  aspirin 81 MG chewable tablet, Chew 81 mg by mouth daily. Reported on  05/15/2015, Disp: , Rfl:  .  metFORMIN (GLUCOPHAGE) 500 MG tablet, Take 1 tablet (500 mg total) by mouth 2 (two) times daily. Pt takes for PCOS, Disp: 180 tablet, Rfl: 1 .  Multiple Vitamins-Minerals (MULTIVITAMIN ADULTS PO), Take by mouth., Disp: , Rfl:  .  omeprazole (PRILOSEC) 20 MG capsule, TAKE 1 CAPSULE BY MOUTH EVERY DAY (Patient not taking: Reported on 01/14/2019), Disp: 90 capsule, Rfl: 1  Allergies  Allergen Reactions  . Codeine Hives and Itching    Objective:   There were no vitals taken for this visit.  Patient is well-developed, well-nourished in no acute distress.  Resting comfortably at home.  Head is normocephalic, atraumatic.  No labored breathing.  Speech is clear and coherent with logical content.  Patient is alert and oriented at baseline.   Assessment and Plan:   1. Gastroesophageal reflux disease without esophagitis Dietary recommendations reviewed.  Avoid late night eating and trigger foods.  Handout sent to MyChart.  We will have her start OTC famotidine twice daily for couple days until her prescription PPI takes effect.  Rx Protonix.  Follow-up in 2 weeks with PCP for reassessment.  Strict return precautions and ER precautions reviewed with patient.  Patient voiced understanding and agreement with plan.    Leeanne Rio, [place] 05/13/2019

## 2019-05-13 NOTE — Patient Instructions (Signed)
Instructions sent to MyChart

## 2019-06-04 ENCOUNTER — Other Ambulatory Visit: Payer: Self-pay | Admitting: Physician Assistant

## 2019-07-06 ENCOUNTER — Other Ambulatory Visit: Payer: Self-pay | Admitting: Family Medicine

## 2019-07-22 ENCOUNTER — Other Ambulatory Visit: Payer: Self-pay | Admitting: Family Medicine

## 2019-07-22 NOTE — Telephone Encounter (Signed)
Opened in error

## 2019-07-30 ENCOUNTER — Telehealth: Payer: BC Managed Care – PPO | Admitting: Emergency Medicine

## 2019-07-30 DIAGNOSIS — J069 Acute upper respiratory infection, unspecified: Secondary | ICD-10-CM

## 2019-07-30 MED ORDER — BENZONATATE 100 MG PO CAPS: 100.0000 mg | ORAL_CAPSULE | Freq: Two times a day (BID) | ORAL | 0 refills | Status: DC | PRN

## 2019-07-30 MED ORDER — FLUTICASONE PROPIONATE 50 MCG/ACT NA SUSP: 2.0000 | Freq: Every day | NASAL | 0 refills | Status: DC

## 2019-07-30 NOTE — Progress Notes (Signed)
We are sorry you are not feeling well.  Here is how we plan to help!  Based on what you have shared with me, it looks like you may have a viral upper respiratory infection.  Upper respiratory infections are caused by a large number of viruses; however, rhinovirus is the most common cause.   Symptoms vary from person to person, with common symptoms including sore throat, cough, fatigue or lack of energy and feeling of general discomfort.  A low-grade fever of up to 100.4 may present, but is often uncommon.  Symptoms vary however, and are closely related to a person's age or underlying illnesses.  The most common symptoms associated with an upper respiratory infection are nasal discharge or congestion, cough, sneezing, headache and pressure in the ears and face.  These symptoms usually persist for about 3 to 10 days, but can last up to 2 weeks.  It is important to know that upper respiratory infections do not cause serious illness or complications in most cases.    If you do develop a significant fever, either make another e-visit or be seen in person.  If fever develops, you may need antibiotics.  Upper respiratory infections can be transmitted from person to person, with the most common method of transmission being a person's hands.  The virus is able to live on the skin and can infect other persons for up to 2 hours after direct contact.  Also, these can be transmitted when someone coughs or sneezes; thus, it is important to cover the mouth to reduce this risk.  To keep the spread of the illness at [place], good hand hygiene is very important.  This is an infection that is most likely caused by a virus. There are no specific treatments other than to help you with the symptoms until the infection runs its course.  We are sorry you are not feeling well.  Here is how we plan to help!   For nasal congestion, you may use an oral decongestants such as Mucinex D or if you have glaucoma or high blood pressure use  plain Mucinex.  Saline nasal spray or nasal drops can help and can safely be used as often as needed for congestion.  For your congestion, I have prescribed Fluticasone nasal spray one spray in each nostril twice a day  If you do not have a history of heart disease, hypertension, diabetes or thyroid disease, prostate/bladder issues or glaucoma, you may also use Sudafed to treat nasal congestion.  It is highly recommended that you consult with a pharmacist or your primary care physician to ensure this medication is safe for you to take.     If you have a cough, you may use cough suppressants such as Delsym and Robitussin.  If you have glaucoma or high blood pressure, you can also use Coricidin HBP.   For cough I have prescribed for you A prescription cough medication called Tessalon Perles 100 mg. You may take 1-2 capsules every 8 hours as needed for cough  If you have a sore or scratchy throat, use a saltwater gargle-  to  teaspoon of salt dissolved in a 4-ounce to 8-ounce glass of warm water.  Gargle the solution for approximately 15-30 seconds and then spit.  It is important not to swallow the solution.  You can also use throat lozenges/cough drops and Chloraseptic spray to help with throat pain or discomfort.  Warm or cold liquids can also be helpful in relieving throat pain.  For  headache, pain or general discomfort, you can use Ibuprofen or Tylenol as directed.   Some authorities believe that zinc sprays or the use of Echinacea may shorten the course of your symptoms.   HOME CARE . Only take medications as instructed by your medical team. . Be sure to drink plenty of fluids. Water is fine as well as fruit juices, sodas and electrolyte beverages. You may want to stay away from caffeine or alcohol. If you are nauseated, try taking small sips of liquids. How do you know if you are getting enough fluid? Your urine should be a pale yellow or almost colorless. . Get rest. . Taking a steamy shower  or using a humidifier may help nasal congestion and ease sore throat pain. You can place a towel over your head and breathe in the steam from hot water coming from a faucet. . Using a saline nasal spray works much the same way. . Cough drops, hard candies and sore throat lozenges may ease your cough. . Avoid close contacts especially the very young and the elderly . Cover your mouth if you cough or sneeze . Always remember to wash your hands.   GET HELP RIGHT AWAY IF: . You develop worsening fever. . If your symptoms do not improve within 10 days . You develop yellow or green discharge from your nose over 3 days. . You have coughing fits . You develop a severe head ache or visual changes. . You develop shortness of breath, difficulty breathing or start having chest pain . Your symptoms persist after you have completed your treatment plan  MAKE SURE YOU   Understand these instructions.  Will watch your condition.  Will get help right away if you are not doing well or get worse.  Your e-visit answers were reviewed by a board certified advanced clinical practitioner to complete your personal care plan. Depending upon the condition, your plan could have included both over the counter or prescription medications. Please review your pharmacy choice. If there is a problem, you may call our nursing hot line at and have the prescription routed to another pharmacy. Your safety is important to [place]. If you have drug allergies check your prescription carefully.   You can use MyChart to ask questions about today's visit, request a non-urgent call back, or ask for a work or school excuse for 24 hours related to this e-Visit. If it has been greater than 24 hours you will need to follow up with your provider, or enter a new e-Visit to address those concerns. You will get an e-mail in the next two days asking about your experience.  I hope that your e-visit has been valuable and will speed your recovery.  Thank you for using e-visits.     Approximately 5 minutes was used in reviewing the patient's chart, questionnaire, prescribing medications, and documentation.

## 2019-09-08 ENCOUNTER — Other Ambulatory Visit: Payer: Self-pay | Admitting: Family Medicine

## 2019-09-13 ENCOUNTER — Other Ambulatory Visit: Payer: Self-pay | Admitting: Family Medicine

## 2019-10-04 ENCOUNTER — Ambulatory Visit: Payer: BC Managed Care – PPO | Admitting: Physician Assistant

## 2019-10-04 ENCOUNTER — Encounter: Payer: Self-pay | Admitting: Physician Assistant

## 2019-10-04 ENCOUNTER — Other Ambulatory Visit: Payer: Self-pay

## 2019-10-04 VITALS — BP 160/90 | HR 112 | Temp 98.4°F | Wt 261.6 lb

## 2019-10-04 DIAGNOSIS — I10 Essential (primary) hypertension: Secondary | ICD-10-CM | POA: Insufficient documentation

## 2019-10-04 MED ORDER — LOSARTAN POTASSIUM 50 MG PO TABS: 50.0000 mg | ORAL_TABLET | Freq: Every day | ORAL | 2 refills | Status: DC

## 2019-10-04 NOTE — Patient Instructions (Signed)
Please go to the lab today for blood work.  I will call you with your results. We will alter treatment regimen(s) if indicated by your results.   Please follow the dietary recommendations below. Continue to stay well-hydrated. Switch to tylenol if needed for headache. Take the losartan daily as directed so we can lower your BP levels. This should resolve headache and other symptoms. Follow-up with Dr. Birdie Riddle or me in 3-4 weeks.  I recommend you check BP periodically at home.   DASH Eating Plan DASH stands for "Dietary Approaches to Stop Hypertension." The DASH eating plan is a healthy eating plan that has been shown to reduce high blood pressure (hypertension). It may also reduce your risk for type 2 diabetes, heart disease, and stroke. The DASH eating plan may also help with weight loss. What are tips for following this plan?  General guidelines  Avoid eating more than 2,300 mg (milligrams) of salt (sodium) a day. If you have hypertension, you may need to reduce your sodium intake to 1,500 mg a day.  Limit alcohol intake to no more than 1 drink a day for nonpregnant women and 2 drinks a day for men. One drink equals 12 oz of beer, 5 oz of wine, or 1 oz of hard liquor.  Work with your health care provider to maintain a healthy body weight or to lose weight. Ask what an ideal weight is for you.  Get at least 30 minutes of exercise that causes your heart to beat faster (aerobic exercise) most days of the week. Activities may include walking, swimming, or biking.  Work with your health care provider or diet and nutrition specialist (dietitian) to adjust your eating plan to your individual calorie needs. Reading food labels   Check food labels for the amount of sodium per serving. Choose foods with less than 5 percent of the Daily Value of sodium. Generally, foods with less than 300 mg of sodium per serving fit into this eating plan.  To find whole grains, look for the word "whole" as  the first word in the ingredient list. Shopping  Buy products labeled as "low-sodium" or "no salt added."  Buy fresh foods. Avoid canned foods and premade or frozen meals. Cooking  Avoid adding salt when cooking. Use salt-free seasonings or herbs instead of table salt or sea salt. Check with your health care provider or pharmacist before using salt substitutes.  Do not fry foods. Cook foods using healthy methods such as baking, boiling, grilling, and broiling instead.  Cook with heart-healthy oils, such as olive, canola, soybean, or sunflower oil. Meal planning  Eat a balanced diet that includes: ? 5 or more servings of fruits and vegetables each day. At each meal, try to fill half of your plate with fruits and vegetables. ? Up to 6-8 servings of whole grains each day. ? Less than 6 oz of lean meat, poultry, or fish each day. A 3-oz serving of meat is about the same size as a deck of cards. One egg equals 1 oz. ? 2 servings of low-fat dairy each day. ? A serving of nuts, seeds, or beans 5 times each week. ? Heart-healthy fats. Healthy fats called Omega-3 fatty acids are found in foods such as flaxseeds and coldwater fish, like sardines, salmon, and mackerel.  Limit how much you eat of the following: ? Canned or prepackaged foods. ? Food that is high in trans fat, such as fried foods. ? Food that is high in saturated fat, such  as fatty meat. ? Sweets, desserts, sugary drinks, and other foods with added sugar. ? Full-fat dairy products.  Do not salt foods before eating.  Try to eat at least 2 vegetarian meals each week.  Eat more home-cooked food and less restaurant, buffet, and fast food.  When eating at a restaurant, ask that your food be prepared with less salt or no salt, if possible. What foods are recommended? The items listed may not be a complete list. Talk with your dietitian about what dietary choices are best for you. Grains Whole-grain or whole-wheat bread.  Whole-grain or whole-wheat pasta. Brown rice. [place]. Bulgur. Whole-grain and low-sodium cereals. Pita bread. Low-fat, low-sodium crackers. Whole-wheat flour tortillas. Vegetables Fresh or frozen vegetables (raw, steamed, roasted, or grilled). Low-sodium or reduced-sodium tomato and vegetable juice. Low-sodium or reduced-sodium tomato sauce and tomato paste. Low-sodium or reduced-sodium canned vegetables. Fruits All fresh, dried, or frozen fruit. Canned fruit in natural juice (without added sugar). Meat and other protein foods Skinless chicken or [place]. Ground chicken or [place]. Pork with fat trimmed off. Fish and seafood. Egg whites. Dried beans, peas, or lentils. Unsalted nuts, nut butters, and seeds. Unsalted canned beans. Lean cuts of beef with fat trimmed off. Low-sodium, lean deli meat. Dairy Low-fat (1%) or fat-free (skim) milk. Fat-free, low-fat, or reduced-fat cheeses. Nonfat, low-sodium ricotta or cottage cheese. Low-fat or nonfat yogurt. Low-fat, low-sodium cheese. Fats and oils Soft margarine without trans fats. Vegetable oil. Low-fat, reduced-fat, or light mayonnaise and salad dressings (reduced-sodium). Canola, safflower, olive, soybean, and sunflower oils. Avocado. Seasoning and other foods Herbs. Spices. Seasoning mixes without salt. Unsalted popcorn and pretzels. Fat-free sweets. What foods are not recommended? The items listed may not be a complete list. Talk with your dietitian about what dietary choices are best for you. Grains Baked goods made with fat, such as croissants, muffins, or some breads. Dry pasta or rice meal packs. Vegetables Creamed or fried vegetables. Vegetables in a cheese sauce. Regular canned vegetables (not low-sodium or reduced-sodium). Regular canned tomato sauce and paste (not low-sodium or reduced-sodium). Regular tomato and vegetable juice (not low-sodium or reduced-sodium). Angie Fava. Olives. Fruits Canned fruit in a light or heavy syrup.  Fried fruit. Fruit in cream or butter sauce. Meat and other protein foods Fatty cuts of meat. Ribs. Fried meat. Berniece Salines. Sausage. Bologna and other processed lunch meats. Salami. Fatback. Hotdogs. Bratwurst. Salted nuts and seeds. Canned beans with added salt. Canned or smoked fish. Whole eggs or egg yolks. Chicken or [place] with skin. Dairy Whole or 2% milk, cream, and half-and-half. Whole or full-fat cream cheese. Whole-fat or sweetened yogurt. Full-fat cheese. Nondairy creamers. Whipped toppings. Processed cheese and cheese spreads. Fats and oils Butter. Stick margarine. Lard. Shortening. Ghee. Bacon fat. Tropical oils, such as coconut, palm kernel, or palm oil. Seasoning and other foods Salted popcorn and pretzels. Onion salt, garlic salt, seasoned salt, table salt, and sea salt. Worcestershire sauce. Tartar sauce. Barbecue sauce. Teriyaki sauce. Soy sauce, including reduced-sodium. Steak sauce. Canned and packaged gravies. Fish sauce. Oyster sauce. Cocktail sauce. Horseradish that you find on the shelf. Ketchup. Mustard. Meat flavorings and tenderizers. Bouillon cubes. Hot sauce and Tabasco sauce. Premade or packaged marinades. Premade or packaged taco seasonings. Relishes. Regular salad dressings. Where to find more information:  National Heart, Lung, and [place]: https://wilson-eaton.com/  American Heart Association: www.heart.org Summary  The DASH eating plan is a healthy eating plan that has been shown to reduce high blood pressure (hypertension). It may also reduce your risk  for type 2 diabetes, heart disease, and stroke.  With the DASH eating plan, you should limit salt (sodium) intake to 2,300 mg a day. If you have hypertension, you may need to reduce your sodium intake to 1,500 mg a day.  When on the DASH eating plan, aim to eat more fresh fruits and vegetables, whole grains, lean proteins, low-fat dairy, and heart-healthy fats.  Work with your health care provider or diet and  nutrition specialist (dietitian) to adjust your eating plan to your individual calorie needs. This information is not intended to replace advice given to you by your health care provider. Make sure you discuss any questions you have with your health care provider. Document Revised: 01/13/2017 Document Reviewed: 01/25/2016 Elsevier Patient Education  2020 [employer].

## 2019-10-04 NOTE — Progress Notes (Signed)
Patient presents to clinic today c/o 2 weeks of intermittent headaches described as bilateral and posterior, aching in nature.  Not associated with neck or shoulder tension.  Denies any photophobia, phonophobia, nausea or vomiting.  Denies vision changes.  Has had a couple episodes of dizziness/lightheadedness.  Denies any nasal congestion, ear pressure, sinus pressure or pain.  Notes she has had significant increase in stressors over the past several months after starting a new job.  Notes because of stressors her diet has been quite horrible, mainly [employer] foods that are high in salt.  Is also been snacking more at her desk.  Does states she stays well-hydrated.  Denies frequent NSAID use.  Denies frequent alcohol consumption.  Is a non-smoker.  Has remote history of hypertension but has not had issue in some time.  Does endorse checking her blood pressure at home periodically over the past couple weeks with elevated numbers.  Notes her highest blood pressure was 150/100. Patient denies any chest pain, racing heart or shortness of breath.  Past Medical History:  Diagnosis Date  . Depression    after father died   . History of gestational diabetes   . Hypertension   . Pregnancy induced hypertension     Current Outpatient Medications on File Prior to Visit  Medication Sig Dispense Refill  . albuterol (VENTOLIN HFA) 108 (90 Base) MCG/ACT inhaler Inhale 2 puffs into the lungs every 6 (six) hours as needed for wheezing or shortness of breath. 18 g 1  . fluticasone (FLONASE) 50 MCG/ACT nasal spray Place 2 sprays into both nostrils daily. 9.9 mL 0  . metFORMIN (GLUCOPHAGE) 500 MG tablet TAKE 1 TABLET (500 MG TOTAL) BY MOUTH 2 (TWO) TIMES DAILY. PT TAKES FOR PCOS 180 tablet 1  . omeprazole (PRILOSEC) 20 MG capsule TAKE 1 CAPSULE BY MOUTH EVERY DAY 90 capsule 1  . pantoprazole (PROTONIX) 20 MG tablet TAKE 1 TABLET BY MOUTH EVERY DAY 30 tablet 1  . Ascorbic Acid (VITAMIN C) 1000 MG tablet Take  1,000 mg by mouth daily. (Patient not taking: Reported on 10/04/2019)    . aspirin 81 MG chewable tablet Chew 81 mg by mouth daily. Reported on 05/15/2015 (Patient not taking: Reported on 10/04/2019)    . benzonatate (TESSALON) 100 MG capsule Take 1 capsule (100 mg total) by mouth 2 (two) times daily as needed for cough. 20 capsule 0  . cholecalciferol (VITAMIN D3) 25 MCG (1000 UNIT) tablet Take 1,000 Units by mouth daily.    Marland Kitchen zinc gluconate 50 MG tablet Take 50 mg by mouth daily. (Patient not taking: Reported on 10/04/2019)     No current facility-administered medications on file prior to visit.    Allergies  Allergen Reactions  . Codeine Hives and Itching    Family History  Problem Relation Age of Onset  . Diabetes Mother   . Cancer Mother        melanoma  . Heart disease Mother   . Heart attack Mother   . Asthma Father   . Cerebral palsy Brother   . Diabetes Brother   . Cancer Sister        breast    Social History   Socioeconomic History  . Marital status: Married    Spouse name: Not on file  . Number of children: Not on file  . Years of education: Not on file  . Highest education level: Not on file  Occupational History  . Not on file  Tobacco Use  .  Smoking status: Never Smoker  . Smokeless tobacco: Never Used  Substance and Sexual Activity  . Alcohol use: Yes    Comment: social  . Drug use: No  . Sexual activity: Yes  Other Topics Concern  . Not on file  Social History Narrative  . Not on file   Social Determinants of Health   Financial Resource Strain:   . Difficulty of Paying Living Expenses: Not on file  Food Insecurity:   . Worried About Charity fundraiser in the Last Year: Not on file  . Ran Out of Food in the Last Year: Not on file  Transportation Needs:   . Lack of Transportation (Medical): Not on file  . Lack of Transportation (Non-Medical): Not on file  Physical Activity:   . Days of Exercise per Week: Not on file  . Minutes of Exercise per  Session: Not on file  Stress:   . Feeling of Stress : Not on file  Social Connections:   . Frequency of Communication with Friends and Family: Not on file  . Frequency of Social Gatherings with Friends and Family: Not on file  . Attends Religious Services: Not on file  . Active Member of Clubs or Organizations: Not on file  . Attends Archivist Meetings: Not on file  . Marital Status: Not on file   Review of Systems - See HPI.  All other ROS are negative.  Wt 261 lb 9.6 oz (118.7 kg)   BMI 42.22 kg/m   Physical Exam Vitals reviewed.  Constitutional:      Appearance: She is well-developed.  HENT:     Head: Normocephalic and atraumatic.     Mouth/Throat:     Mouth: Mucous membranes are moist.  Eyes:     Pupils: Pupils are equal, round, and reactive to light.  Cardiovascular:     Rate and Rhythm: Normal rate and regular rhythm.     Heart sounds: Normal heart sounds.  Pulmonary:     Effort: Pulmonary effort is normal.     Breath sounds: Normal breath sounds.  Neurological:     Mental Status: She is alert.  Psychiatric:        Mood and Affect: Mood normal.    Assessment/Plan: 1. Essential hypertension BP above goal.  Since she has had several measurements that were elevated at home and at recent biometric screening at work.  This is the likely cause of her headaches.  Examination thankfully unremarkable.  Discussed proper diet and hydration.  Exercise recommendations reviewed.  Will check CMP and TSH today.  We will plan to start losartan 50 mg once daily.  She is to check home blood pressure and record.  Bring to follow-up with myself or PCP in 3 to 4 weeks.  Return sooner if needed. - Comp Met (CMET) - TSH - losartan (COZAAR) 50 MG tablet; Take 1 tablet (50 mg total) by mouth daily.  Dispense: 30 tablet; Refill: 2  2. Morbid obesity ([place]) Recent increase in weight secondary to stressors.  This is having a negative impact on her blood pressure.  We will have  patient increase fluids.  Decrease caffeine intake.  Dietary and exercise recommendations reviewed with patient.  Follow-up with PCP as discussed for ongoing management.   This visit occurred during the SARS-CoV-2 public health emergency.  Safety protocols were in place, including screening questions prior to the visit, additional usage of staff PPE, and extensive cleaning of exam room while observing appropriate contact time as  indicated for disinfecting solutions.     Leeanne Rio, PA-C

## 2019-10-05 LAB — COMPREHENSIVE METABOLIC PANEL
AG Ratio: 1.5 (calc) (ref 1.0–2.5)
ALT: 18 U/L (ref 6–29)
AST: 15 U/L (ref 10–35)
Albumin: 4.1 g/dL (ref 3.6–5.1)
Alkaline phosphatase (APISO): 151 U/L — ABNORMAL HIGH (ref 31–125)
BUN: 9 mg/dL (ref 7–25)
CO2: 26 mmol/L (ref 20–32)
Calcium: 8.9 mg/dL (ref 8.6–10.2)
Chloride: 102 mmol/L (ref 98–110)
Creat: 0.74 mg/dL (ref 0.50–1.10)
Globulin: 2.7 g/dL (calc) (ref 1.9–3.7)
Glucose, Bld: 101 mg/dL — ABNORMAL HIGH (ref 65–99)
Potassium: 3.7 mmol/L (ref 3.5–5.3)
Sodium: 137 mmol/L (ref 135–146)
Total Bilirubin: 0.5 mg/dL (ref 0.2–1.2)
Total Protein: 6.8 g/dL (ref 6.1–8.1)

## 2019-10-05 LAB — EXTRA LAV TOP TUBE

## 2019-10-05 LAB — TSH: TSH: 1.33 mIU/L

## 2019-10-11 ENCOUNTER — Ambulatory Visit: Payer: BC Managed Care – PPO | Admitting: Family Medicine

## 2019-10-11 DIAGNOSIS — Z20822 Contact with and (suspected) exposure to covid-19: Secondary | ICD-10-CM | POA: Diagnosis not present

## 2019-10-11 DIAGNOSIS — J069 Acute upper respiratory infection, unspecified: Secondary | ICD-10-CM | POA: Diagnosis not present

## 2019-10-13 ENCOUNTER — Encounter: Payer: Self-pay | Admitting: Family Medicine

## 2019-10-15 DIAGNOSIS — J069 Acute upper respiratory infection, unspecified: Secondary | ICD-10-CM | POA: Diagnosis not present

## 2019-10-28 ENCOUNTER — Encounter: Payer: Self-pay | Admitting: Family Medicine

## 2019-10-28 ENCOUNTER — Other Ambulatory Visit: Payer: Self-pay

## 2019-10-28 ENCOUNTER — Ambulatory Visit: Payer: BC Managed Care – PPO | Admitting: Family Medicine

## 2019-10-28 VITALS — BP 130/70 | HR 78 | Temp 98.2°F | Resp 16 | Ht 66.0 in | Wt 260.1 lb

## 2019-10-28 DIAGNOSIS — I10 Essential (primary) hypertension: Secondary | ICD-10-CM

## 2019-10-28 NOTE — Patient Instructions (Signed)
Schedule your complete physical in 3-4 months We'll notify you of your lab results and make any changes if needed CONTINUE the Losartan daily CONTINUE to work on healthy diet and regular exercise- you can do it! Call with any questions or concerns Stay Safe!  Stay Healthy!

## 2019-10-28 NOTE — Assessment & Plan Note (Signed)
Chronic problem.  Much better control since adding Losartan 50mg  daily.  Currently asymptomatic.  Check BMP due to addition of ARB.  Will follow.

## 2019-10-28 NOTE — Progress Notes (Signed)
   Subjective:    Patient ID: Carol Houston, female    DOB: Dec 07, 1970, 49 y.o.   MRN: [id number]  HPI HTN- chronic problem.  Pt was started on Losartan 50mg  daily at last visit.  BP at last visit was 160/90 and today is 130/70.  Pt reports feeling better.  'no headache'.  Better sleep.  Home BP readings have been 120s-60s during the last week.  No CP, SOB, visual changes, edema.   Review of Systems For ROS see HPI   This visit occurred during the SARS-CoV-2 public health emergency.  Safety protocols were in place, including screening questions prior to the visit, additional usage of staff PPE, and extensive cleaning of exam room while observing appropriate contact time as indicated for disinfecting solutions.       Objective:   Physical Exam Vitals reviewed.  Constitutional:      General: She is not in acute distress.    Appearance: She is well-developed. She is obese.  HENT:     Head: Normocephalic and atraumatic.  Eyes:     Conjunctiva/sclera: Conjunctivae normal.     Pupils: Pupils are equal, round, and reactive to light.  Neck:     Thyroid: No thyromegaly.  Cardiovascular:     Rate and Rhythm: Normal rate and regular rhythm.     Heart sounds: Normal heart sounds. No murmur heard.   Pulmonary:     Effort: Pulmonary effort is normal. No respiratory distress.     Breath sounds: Normal breath sounds.  Abdominal:     General: There is no distension.     Palpations: Abdomen is soft.     Tenderness: There is no abdominal tenderness.  Musculoskeletal:     Cervical back: Normal range of motion and neck supple.  Lymphadenopathy:     Cervical: No cervical adenopathy.  Skin:    General: Skin is warm and dry.  Neurological:     Mental Status: She is alert and oriented to person, place, and time.  Psychiatric:        Behavior: Behavior normal.           Assessment & Plan:

## 2019-10-29 ENCOUNTER — Ambulatory Visit: Payer: BC Managed Care – PPO

## 2019-10-29 LAB — BASIC METABOLIC PANEL
BUN: 11 mg/dL (ref 6–23)
CO2: 27 mEq/L (ref 19–32)
Calcium: 9 mg/dL (ref 8.4–10.5)
Chloride: 101 mEq/L (ref 96–112)
Creatinine, Ser: 0.79 mg/dL (ref 0.40–1.20)
GFR: 78.1 mL/min (ref >60.00)
Glucose, Bld: 111 mg/dL — ABNORMAL HIGH (ref 70–99)
Potassium: 4.9 mEq/L (ref 3.5–5.1)
Sodium: 137 mEq/L (ref 135–145)

## 2019-12-27 ENCOUNTER — Other Ambulatory Visit: Payer: Self-pay | Admitting: Physician Assistant

## 2019-12-27 DIAGNOSIS — I10 Essential (primary) hypertension: Secondary | ICD-10-CM

## 2020-01-16 ENCOUNTER — Telehealth (INDEPENDENT_AMBULATORY_CARE_PROVIDER_SITE_OTHER): Payer: BC Managed Care – PPO | Admitting: Family Medicine

## 2020-01-16 ENCOUNTER — Encounter: Payer: Self-pay | Admitting: Family Medicine

## 2020-01-16 ENCOUNTER — Other Ambulatory Visit: Payer: Self-pay

## 2020-01-16 DIAGNOSIS — R059 Cough, unspecified: Secondary | ICD-10-CM | POA: Diagnosis not present

## 2020-01-16 DIAGNOSIS — J111 Influenza due to unidentified influenza virus with other respiratory manifestations: Secondary | ICD-10-CM | POA: Diagnosis not present

## 2020-01-16 MED ORDER — OSELTAMIVIR PHOSPHATE 75 MG PO CAPS: 75.0000 mg | ORAL_CAPSULE | Freq: Two times a day (BID) | ORAL | 0 refills | Status: DC

## 2020-01-16 NOTE — Progress Notes (Signed)
Virtual Visit via Telephone Note  I connected with Carol Houston on 01/16/20 at  1:20 PM EST by telephone and verified that I am speaking with the correct person using two identifiers.   I discussed the limitations, risks, security and privacy concerns of performing an evaluation and management service by telephone and the availability of in person appointments. I also discussed with the patient that there may be a patient responsible charge related to this service. The patient expressed understanding and agreed to proceed.  Location patient: home, [place] Location provider: work or home office Participants present for the call: patient, provider Patient did not have a visit with me in the prior 7 days to address this/these issue(s).   History of Present Illness:  Acute telemedicine visit for cough and congestion: -Onset: about 2-3 days ago -Symptoms include: nasal congestion, cough, low grade fever around 101 high, sinus congestion  -Denies: body ache, HA, NVD, CP, SOB, loss of taste or smell -no known sick contacts -Pertinent past medical history: none per patient -Pertinent medication allergies:codiene -COVID-19 vaccine status: fully vaccinated for COVID19 (has not had booster); has flu vaccine   Observations/Objective: Patient sounds cheerful and well on the phone. I do not appreciate any SOB. Speech and thought processing are grossly intact. Patient reported vitals:  Assessment and Plan:  Influenza-like illness  -we discussed possible serious and likely etiologies, options for evaluation and workup, limitations of telemedicine visit vs in person visit, treatment, treatment risks and precautions. Pt prefers to treat via telemedicine empirically rather than in person at this moment.  Query influenza, viral upper respiratory illness, Covid breakthrough case versus other.  She opted for starting empiric treatment for Tamiflu for past medical history risk factors, Covid testing and  symptomatic care.  Discussed options for Covid testing, treatment, potential complications and precautions. Work/School slipped offered: provided in patient instructions   Scheduled follow up with PCP offered: She agrees to follow-up if needed Advised to seek prompt in person care if worsening, new symptoms arise, or if is not improving with treatment. Advised of options for inperson care in case PCP office not available.   Follow Up Instructions:  I did not refer this patient for an OV with me in the next 24 hours for this/these issue(s).  I discussed the assessment and treatment plan with the patient. The patient was provided an opportunity to ask questions and all were answered. The patient agreed with the plan and demonstrated an understanding of the instructions.   I spent 15 minutes on this encounter.   Lucretia Kern, DO

## 2020-01-16 NOTE — Patient Instructions (Addendum)
   ---------------------------------------------------------------------------------------------------------------------------      WORK SLIP:  Patient Carol Houston,  26-Nov-1970, was seen for a medical visit today, 01/16/20 . Please excuse from work according to the [facility] guidelines for a COVID like illness. We advise 10 days minimum from the onset of symptoms (01/14/20) PLUS 1 day of no fever and improved symptoms. Will defer to employer for a sooner return to work if [place] testing is negative and the symptoms have resolved. Advise following CDC guidelines.    Sincerely: E-signature: Dr. Colin Benton, DO [place] Ph: [phone]   ------------------------------------------------------------------------------------------------------------------------------    HOME CARE TIPS:  -[place] testing information: https://www.[facility].org/ OR [phone] Most pharmacies also offer testing and home test kits.  -I sent the medication(s) we discussed to your pharmacy: Meds ordered this encounter  Medications  . oseltamivir (TAMIFLU) 75 MG capsule    Sig: Take 1 capsule (75 mg total) by mouth 2 (two) times daily.    Dispense:  10 capsule    Refill:  0    IF the covid test is positive stop the Tamiflu, consider the MAB treatment for covid (see below) and schedule a follow up with your primary care doctor.   -COVID19 outpatient treatment center: [phone] (only call if your Covid test is positive and you are interested in monoclonal antibody treatment which is available to those with risk factors within 10 days of symptom onset)  -can use tylenol or aleve if needed for fevers, aches and pains per instructions  -can use nasal saline a few times per day if nasal congestion, sometime a short course of Afrin nasal spray for 3 days can help as well  -stay hydrated, drink plenty of fluids and eat small healthy meals - avoid  dairy  -If the Covid test is positive, check out the [facility] website for more information on home care, transmission and treatment for COVID19  -follow up with your doctor in 2-3 days unless improving and feeling better  -stay home while sick, except to seek medical care, and if you have [place] please stay home for a full 10 days since the onset of symptoms PLUS one day of no fever and feeling better.  It was nice to meet you today, and I really hope you are feeling better soon. I help [place] out with telemedicine visits on Tuesdays and Thursdays and am available for visits on those days. If you have any concerns or questions following this visit please schedule a follow up visit with your Primary Care doctor or seek care at a local urgent care clinic to avoid delays in care.    Seek in person care promptly if your symptoms worsen, new concerns arise or you are not improving with treatment. Call 911 and/or seek emergency care if you symptoms are severe or life threatening.

## 2020-01-22 ENCOUNTER — Encounter: Payer: Self-pay | Admitting: Family Medicine

## 2020-01-22 DIAGNOSIS — Z03818 Encounter for observation for suspected exposure to other biological agents ruled out: Secondary | ICD-10-CM | POA: Diagnosis not present

## 2020-01-22 DIAGNOSIS — R062 Wheezing: Secondary | ICD-10-CM | POA: Diagnosis not present

## 2020-01-22 DIAGNOSIS — J019 Acute sinusitis, unspecified: Secondary | ICD-10-CM | POA: Diagnosis not present

## 2020-01-22 NOTE — Telephone Encounter (Signed)
Would need appointment since we have not assessed her at this office.

## 2020-01-23 ENCOUNTER — Telehealth: Payer: BC Managed Care – PPO | Admitting: Physician Assistant

## 2020-01-27 ENCOUNTER — Ambulatory Visit (INDEPENDENT_AMBULATORY_CARE_PROVIDER_SITE_OTHER): Payer: BC Managed Care – PPO | Admitting: Family Medicine

## 2020-01-27 ENCOUNTER — Encounter: Payer: Self-pay | Admitting: Family Medicine

## 2020-01-27 ENCOUNTER — Other Ambulatory Visit: Payer: Self-pay

## 2020-01-27 VITALS — BP 128/70 | HR 95 | Temp 98.8°F | Resp 20 | Ht 66.0 in | Wt 252.6 lb

## 2020-01-27 DIAGNOSIS — E8881 Metabolic syndrome: Secondary | ICD-10-CM | POA: Diagnosis not present

## 2020-01-27 DIAGNOSIS — Z1211 Encounter for screening for malignant neoplasm of colon: Secondary | ICD-10-CM | POA: Diagnosis not present

## 2020-01-27 DIAGNOSIS — Z0000 Encounter for general adult medical examination without abnormal findings: Secondary | ICD-10-CM

## 2020-01-27 DIAGNOSIS — E88819 Insulin resistance, unspecified: Secondary | ICD-10-CM | POA: Insufficient documentation

## 2020-01-27 LAB — CBC WITH DIFFERENTIAL/PLATELET
Basophils Absolute: 0 10*3/uL (ref 0.0–0.1)
Basophils Relative: 0.2 % (ref 0.0–3.0)
Eosinophils Absolute: 0.1 10*3/uL (ref 0.0–0.7)
Eosinophils Relative: 0.6 % (ref 0.0–5.0)
HCT: 35.5 % — ABNORMAL LOW (ref 36.0–46.0)
Hemoglobin: 10.6 g/dL — ABNORMAL LOW (ref 12.0–15.0)
Lymphocytes Relative: 37.5 % (ref 12.0–46.0)
Lymphs Abs: 4.2 10*3/uL — ABNORMAL HIGH (ref 0.7–4.0)
MCHC: 29.9 g/dL — ABNORMAL LOW (ref 30.0–36.0)
MCV: 71.8 fl — ABNORMAL LOW (ref 78.0–100.0)
Monocytes Absolute: 1.1 10*3/uL — ABNORMAL HIGH (ref 0.1–1.0)
Monocytes Relative: 10.2 % (ref 3.0–12.0)
Neutro Abs: 5.7 10*3/uL (ref 1.4–7.7)
Neutrophils Relative %: 51.5 % (ref 43.0–77.0)
Platelets: 484 10*3/uL — ABNORMAL HIGH (ref 150.0–400.0)
RBC: 4.94 Mil/uL (ref 3.87–5.11)
RDW: 18 % — ABNORMAL HIGH (ref 11.5–15.5)
WBC: 11.1 10*3/uL — ABNORMAL HIGH (ref 4.0–10.5)

## 2020-01-27 LAB — HEPATIC FUNCTION PANEL
ALT: 15 U/L (ref 0–35)
AST: 8 U/L (ref 0–37)
Albumin: 4 g/dL (ref 3.5–5.2)
Alkaline Phosphatase: 132 U/L — ABNORMAL HIGH (ref 39–117)
Bilirubin, Direct: 0.1 mg/dL (ref 0.0–0.3)
Total Bilirubin: 0.6 mg/dL (ref 0.2–1.2)
Total Protein: 6.8 g/dL (ref 6.0–8.3)

## 2020-01-27 LAB — HEMOGLOBIN A1C: Hgb A1c MFr Bld: 6.6 % — ABNORMAL HIGH (ref 4.6–6.5)

## 2020-01-27 LAB — LIPID PANEL
Cholesterol: 142 mg/dL (ref 0–200)
HDL: 67.3 mg/dL (ref >39.00)
LDL Cholesterol: 57 mg/dL (ref 0–99)
NonHDL: 75.05
Total CHOL/HDL Ratio: 2
Triglycerides: 91 mg/dL (ref 0.0–149.0)
VLDL: 18.2 mg/dL (ref 0.0–40.0)

## 2020-01-27 LAB — BASIC METABOLIC PANEL
BUN: 13 mg/dL (ref 6–23)
CO2: 28 mEq/L (ref 19–32)
Calcium: 9.1 mg/dL (ref 8.4–10.5)
Chloride: 103 mEq/L (ref 96–112)
Creatinine, Ser: 0.83 mg/dL (ref 0.40–1.20)
GFR: 84.22 mL/min (ref >60.00)
Glucose, Bld: 93 mg/dL (ref 70–99)
Potassium: 4.3 mEq/L (ref 3.5–5.1)
Sodium: 139 mEq/L (ref 135–145)

## 2020-01-27 LAB — VITAMIN D 25 HYDROXY (VIT D DEFICIENCY, FRACTURES): VITD: 24.8 ng/mL — ABNORMAL LOW (ref 30.00–100.00)

## 2020-01-27 LAB — TSH: TSH: 2.31 u[IU]/mL (ref 0.35–4.50)

## 2020-01-27 MED ORDER — OZEMPIC (0.25 OR 0.5 MG/DOSE) 2 MG/1.5ML ~~LOC~~ SOPN: PEN_INJECTOR | SUBCUTANEOUS | 3 refills | Status: DC

## 2020-01-27 NOTE — Assessment & Plan Note (Signed)
Ongoing issue for pt.  She is down 8 lbs since September.  Encouraged healthy diet and regular exercise.  Will start Ozempic (in addition to healthy diet and regular exercise) and monitor weight loss progress.  Pt expressed understanding and is in agreement w/ plan.

## 2020-01-27 NOTE — Assessment & Plan Note (Signed)
Pt's PE WNL w/ exception of obesity.  Due to start colon cancer screen- referral placed.  Has pap and mammo scheduled.  UTD on immunizations.  Check labs.  Anticipatory guidance provided.

## 2020-01-27 NOTE — Assessment & Plan Note (Signed)
Ongoing issue as part of her PCOS.  On Metformin.  Interested in [place] for both the insulin resistance and weight loss properties.  Will start and follow closely.  Pt expressed understanding and is in agreement w/ plan.

## 2020-01-27 NOTE — Patient Instructions (Addendum)
Follow up in 6 weeks to recheck weight loss progress We'll notify you of your lab results and make any changes if needed Continue to work on healthy diet and regular exercise- you can do it! Start the Ozempic as directed Have them send me a copy of your pap and mammo Call with any questions or concerns Stay Safe!  Stay Healthy! Happy Holidays!!

## 2020-01-27 NOTE — Progress Notes (Signed)
   Subjective:    Patient ID: Carol Houston, female    DOB: [birth year], 49 y.o.   MRN: [id number]  HPI CPE- UTD on Tdap, flu, COVID vaccines.  Due for pap and mammo- scheduled for January.  Due to start colon cancer screen.  Reviewed past medical, surgical, family and social histories.   Patient Care Team    Relationship Specialty Notifications Start End  Midge Minium, MD PCP - General   01/27/10   Aloha Gell, MD Consulting Physician Obstetrics and Gynecology  04/27/16     Health Maintenance  Topic Date Due  . PAP SMEAR-Modifier  02/19/2020 (Originally 02/02/2019)  . Hepatitis C Screening  10/27/2020 (Originally 13-Jul-1970)  . TETANUS/TDAP  09/17/2024  . INFLUENZA VACCINE  Completed  . COVID-19 Vaccine  Completed  . HIV Screening  Completed     Review of Systems Patient reports no vision/ hearing changes, adenopathy,fever, weight change,  persistant/recurrent hoarseness , swallowing issues, chest pain, palpitations, edema, persistant/recurrent cough, hemoptysis, dyspnea (rest/exertional/paroxysmal nocturnal), gastrointestinal bleeding (melena, rectal bleeding), abdominal pain, significant heartburn, bowel changes, GU symptoms (dysuria, hematuria, incontinence), Gyn symptoms (abnormal  bleeding, pain),  syncope, focal weakness, memory loss, numbness & tingling, skin/hair/nail changes, abnormal bruising or bleeding, anxiety, or depression.   This visit occurred during the SARS-CoV-2 public health emergency.  Safety protocols were in place, including screening questions prior to the visit, additional usage of staff PPE, and extensive cleaning of exam room while observing appropriate contact time as indicated for disinfecting solutions.       Objective:   Physical Exam General Appearance:    Alert, cooperative, no distress, appears stated age, obese  Head:    Normocephalic, without obvious abnormality, atraumatic  Eyes:    PERRL, conjunctiva/corneas clear, EOM's intact, fundi     benign, both eyes  Ears:    Normal TM's and external ear canals, both ears  Nose:   Deferred due to COVID  Throat:   Neck:   Supple, symmetrical, trachea midline, no adenopathy;    Thyroid: no enlargement/tenderness/nodules  Back:     Symmetric, no curvature, ROM normal, no CVA tenderness  Lungs:     Clear to auscultation bilaterally, respirations unlabored  Chest Wall:    No tenderness or deformity   Heart:    Regular rate and rhythm, S1 and S2 normal, no murmur, rub   or gallop  Breast Exam:    Deferred to GYN  Abdomen:     Soft, non-tender, bowel sounds active all four quadrants,    no masses, no organomegaly  Genitalia:    Deferred to GYN  Rectal:    Extremities:   Extremities normal, atraumatic, no cyanosis or edema  Pulses:   2+ and symmetric all extremities  Skin:   Skin color, texture, turgor normal, no rashes or lesions  Lymph nodes:   Cervical, supraclavicular, and axillary nodes normal  Neurologic:   CNII-XII intact, normal strength, sensation and reflexes    throughout          Assessment & Plan:

## 2020-01-28 ENCOUNTER — Other Ambulatory Visit: Payer: Self-pay

## 2020-01-28 DIAGNOSIS — E559 Vitamin D deficiency, unspecified: Secondary | ICD-10-CM

## 2020-01-28 MED ORDER — VITAMIN D (ERGOCALCIFEROL) 1.25 MG (50000 UNIT) PO CAPS: 50000.0000 [IU] | ORAL_CAPSULE | ORAL | 0 refills | Status: DC

## 2020-02-17 ENCOUNTER — Other Ambulatory Visit: Payer: Self-pay

## 2020-02-17 ENCOUNTER — Encounter: Payer: Self-pay | Admitting: Family Medicine

## 2020-02-17 ENCOUNTER — Telehealth (INDEPENDENT_AMBULATORY_CARE_PROVIDER_SITE_OTHER): Payer: BC Managed Care – PPO | Admitting: Family Medicine

## 2020-02-17 VITALS — Temp 98.2°F

## 2020-02-17 DIAGNOSIS — J019 Acute sinusitis, unspecified: Secondary | ICD-10-CM | POA: Diagnosis not present

## 2020-02-17 DIAGNOSIS — J00 Acute nasopharyngitis [common cold]: Secondary | ICD-10-CM | POA: Diagnosis not present

## 2020-02-17 MED ORDER — MONTELUKAST SODIUM 10 MG PO TABS: 10.0000 mg | ORAL_TABLET | Freq: Every day | ORAL | 3 refills | Status: DC

## 2020-02-17 MED ORDER — AZELASTINE HCL 0.1 % NA SOLN: 2.0000 | Freq: Two times a day (BID) | NASAL | 12 refills | Status: AC

## 2020-02-17 NOTE — Progress Notes (Signed)
I connected with  Carol Houston on 02/17/20 by a video enabled telemedicine application and verified that I am speaking with the correct person using two identifiers.   I discussed the limitations of evaluation and management by telemedicine. The patient expressed understanding and agreed to proceed.

## 2020-02-17 NOTE — Progress Notes (Signed)
Virtual Visit via Video   I connected with patient on 02/17/20 at  1:30 PM EST by a video enabled telemedicine application and verified that I am speaking with the correct person using two identifiers.  Location patient: Home Location provider: [place], Office Persons participating in the virtual visit: Patient, Provider, CMA (Sabrina M)  I discussed the limitations of evaluation and management by telemedicine and the availability of in person appointments. The patient expressed understanding and agreed to proceed.  Subjective:   HPI:   Congestion- pt reports sxs started ~6 weeks ago.  In that time frame she has completed prednisone x2, Zpack, and Amoxicillin.  No improvement.  Pt reports things improved x4-5 days but then 'came back w/ a vengeance'.  Some days feel worse than others.  Most bothersome sxs are the nasal congestion/runny nose and sinus pressure.  + cough.  No fever.  + chills last night.  No night sweats.  No known sick contacts- 'husband and son are both fine'.  Denies HA.  Using Flonase regularly, Afrin 1-2x.  + SOB w/ stairs- has to use inhaler.  Pt has had 4 COVID tests- all negative.  Not currently taking Claritin or Zyrtec.  ROS:   See pertinent positives and negatives per HPI.  Patient Active Problem List   Diagnosis Date Noted  . Insulin resistance 01/27/2020  . Morbid obesity ([facility]) 10/04/2019  . Essential hypertension 10/04/2019  . PCOS (polycystic ovarian syndrome) 11/08/2017  . Physical exam 05/15/2015  . HEADACHE 10/27/2008    Social History   Tobacco Use  . Smoking status: Never Smoker  . Smokeless tobacco: Never Used  Substance Use Topics  . Alcohol use: Yes    Comment: social    Current Outpatient Medications:  .  albuterol (VENTOLIN HFA) 108 (90 Base) MCG/ACT inhaler, Inhale 2 puffs into the lungs every 6 (six) hours as needed for wheezing or shortness of breath., Disp: 18 g, Rfl: 1 .  losartan (COZAAR) 50 MG tablet, TAKE 1  TABLET BY MOUTH EVERY DAY, Disp: 90 tablet, Rfl: 0 .  metFORMIN (GLUCOPHAGE) 500 MG tablet, TAKE 1 TABLET (500 MG TOTAL) BY MOUTH 2 (TWO) TIMES DAILY. PT TAKES FOR PCOS, Disp: 180 tablet, Rfl: 1 .  Semaglutide,0.25 or 0.5MG /DOS, (OZEMPIC, 0.25 OR 0.5 MG/DOSE,) 2 MG/1.5ML SOPN, Inject 0.25 mg into the skin once a week for 28 days, THEN 0.5 mg once a week for 28 days., Disp: 1.5 mL, Rfl: 3 .  Vitamin D, Ergocalciferol, (DRISDOL) 1.25 MG (50000 UNIT) CAPS capsule, Take 1 capsule (50,000 Units total) by mouth every 7 (seven) days., Disp: 12 capsule, Rfl: 0 .  amoxicillin-clavulanate (AUGMENTIN) 875-125 MG tablet, Take 1 tablet by mouth 2 (two) times daily. (Patient not taking: Reported on 02/17/2020), Disp: , Rfl:  .  Famotidine (PEPCID PO), Take by mouth. (Patient not taking: Reported on 02/17/2020), Disp: , Rfl:  .  oseltamivir (TAMIFLU) 75 MG capsule, Take 1 capsule (75 mg total) by mouth 2 (two) times daily. (Patient not taking: Reported on 02/17/2020), Disp: 10 capsule, Rfl: 0 .  predniSONE (STERAPRED UNI-PAK 21 TAB) 10 MG (21) TBPK tablet, Take by mouth. (Patient not taking: Reported on 02/17/2020), Disp: , Rfl:  .  predniSONE (STERAPRED UNI-PAK 21 TAB) 5 MG (21) TBPK tablet, Take by mouth. (Patient not taking: Reported on 02/17/2020), Disp: , Rfl:   Allergies  Allergen Reactions  . Codeine Hives and Itching    Objective:   Temp 98.2 F (36.8 C) (Temporal)  AAOx3, NAD NCAT, EOMI No obvious CN deficits Audible nasal congestion + dry cough Pt is able to speak clearly, coherently without shortness of breath or increased work of breathing.  Thought process is linear.  Mood is appropriate.   Assessment and Plan:   Rhinitis/sinusitis- ongoing issue for pt x6 weeks.  She has completed 2 rounds of antibiotics and 2 rounds of prednisone w/o improvement.  Will add Astelin to the Flonase she is already taking.  She is to take daily antihistamine and we will start Singulair.  Will refer to ENT in case  these interventions are unsuccessful.  Pt expressed understanding and is in agreement w/ plan.    Annye Asa, MD 02/17/2020

## 2020-03-03 ENCOUNTER — Ambulatory Visit (INDEPENDENT_AMBULATORY_CARE_PROVIDER_SITE_OTHER): Payer: BC Managed Care – PPO | Admitting: Otolaryngology

## 2020-03-09 ENCOUNTER — Encounter: Payer: Self-pay | Admitting: Family Medicine

## 2020-03-09 ENCOUNTER — Telehealth (INDEPENDENT_AMBULATORY_CARE_PROVIDER_SITE_OTHER): Payer: BC Managed Care – PPO | Admitting: Family Medicine

## 2020-03-09 NOTE — Progress Notes (Signed)
Virtual Visit via Video   I connected with patient on 03/09/20 at  1:00 PM EST by a video enabled telemedicine application and verified that I am speaking with the correct person using two identifiers.  Location patient: Home Location provider: [facility] Office Persons participating in the virtual visit: Patient, Provider, [place] Gabriel Cirri)  I discussed the limitations of evaluation and management by telemedicine and the availability of in person appointments. The patient expressed understanding and agreed to proceed.  Subjective:   HPI:   Obesity- pt was started on Ozempic at CPE.  Initially had excessive nausea but this is improving w/ time.  Has been able to increase to the 0.5mg  dose.  Pt reports she has not been able to find her home scales but she is able to note the weight loss in how her clothes are fitting.  Pt notes that she will still get hungry but is full on much less than before.  'i'm eating like half the food I was eating'.  Got a treadmill and is walking regularly.  ROS:   See pertinent positives and negatives per HPI.  Patient Active Problem List   Diagnosis Date Noted  . Insulin resistance 01/27/2020  . Morbid obesity ([place]) 10/04/2019  . Essential hypertension 10/04/2019  . PCOS (polycystic ovarian syndrome) 11/08/2017  . Physical exam 05/15/2015  . HEADACHE 10/27/2008    Social History   Tobacco Use  . Smoking status: Never Smoker  . Smokeless tobacco: Never Used  Substance Use Topics  . Alcohol use: Yes    Comment: social    Current Outpatient Medications:  .  albuterol (VENTOLIN HFA) 108 (90 Base) MCG/ACT inhaler, Inhale 2 puffs into the lungs every 6 (six) hours as needed for wheezing or shortness of breath., Disp: 18 g, Rfl: 1 .  azelastine (ASTELIN) 0.1 % nasal spray, Place 2 sprays into both nostrils 2 (two) times daily. Use in each nostril as directed, Disp: 30 mL, Rfl: 12 .  losartan (COZAAR) 50 MG tablet, TAKE 1 TABLET BY MOUTH EVERY  DAY, Disp: 90 tablet, Rfl: 0 .  metFORMIN (GLUCOPHAGE) 500 MG tablet, TAKE 1 TABLET (500 MG TOTAL) BY MOUTH 2 (TWO) TIMES DAILY. PT TAKES FOR PCOS, Disp: 180 tablet, Rfl: 1 .  montelukast (SINGULAIR) 10 MG tablet, Take 1 tablet (10 mg total) by mouth at bedtime., Disp: 30 tablet, Rfl: 3 .  Semaglutide,0.25 or 0.5MG /DOS, (OZEMPIC, 0.25 OR 0.5 MG/DOSE,) 2 MG/1.5ML SOPN, Inject 0.25 mg into the skin once a week for 28 days, THEN 0.5 mg once a week for 28 days., Disp: 1.5 mL, Rfl: 3 .  Vitamin D, Ergocalciferol, (DRISDOL) 1.25 MG (50000 UNIT) CAPS capsule, Take 1 capsule (50,000 Units total) by mouth every 7 (seven) days., Disp: 12 capsule, Rfl: 0  Allergies  Allergen Reactions  . Codeine Hives and Itching    Objective:   There were no vitals taken for this visit. AAOx3, NAD NCAT, EOMI No obvious CN deficits Pt is able to speak clearly, coherently without shortness of breath or increased work of breathing.  Thought process is linear.  Mood is appropriate.   Assessment and Plan:   Obesity- pt is now doing well on Ozempic.  Initially she had quite a bit of nausea but this has subsided.  She has increased to the 0.5mg  dose weekly and notes a change in the way her clothes are fitting.  Applauded her efforts at healthy diet and regular activity.  Will follow.  Annye Asa, MD 03/09/2020

## 2020-03-09 NOTE — Progress Notes (Signed)
I connected with  Carol Houston on 03/09/20 by a video enabled telemedicine application and verified that I am speaking with the correct person using two identifiers.   I discussed the limitations of evaluation and management by telemedicine. The patient expressed understanding and agreed to proceed.

## 2020-03-13 DIAGNOSIS — F32A Depression, unspecified: Secondary | ICD-10-CM | POA: Insufficient documentation

## 2020-03-13 DIAGNOSIS — N83209 Unspecified ovarian cyst, unspecified side: Secondary | ICD-10-CM | POA: Insufficient documentation

## 2020-03-13 DIAGNOSIS — K649 Unspecified hemorrhoids: Secondary | ICD-10-CM | POA: Insufficient documentation

## 2020-03-13 DIAGNOSIS — O24439 Gestational diabetes mellitus in the puerperium, unspecified control: Secondary | ICD-10-CM | POA: Insufficient documentation

## 2020-03-13 DIAGNOSIS — N915 Oligomenorrhea, unspecified: Secondary | ICD-10-CM | POA: Insufficient documentation

## 2020-03-13 DIAGNOSIS — D649 Anemia, unspecified: Secondary | ICD-10-CM | POA: Insufficient documentation

## 2020-03-16 ENCOUNTER — Encounter: Payer: Self-pay | Admitting: Family Medicine

## 2020-03-16 ENCOUNTER — Ambulatory Visit (INDEPENDENT_AMBULATORY_CARE_PROVIDER_SITE_OTHER): Payer: BC Managed Care – PPO | Admitting: Otolaryngology

## 2020-03-16 ENCOUNTER — Other Ambulatory Visit: Payer: Self-pay

## 2020-03-16 VITALS — Temp 98.1°F

## 2020-03-16 DIAGNOSIS — Z8709 Personal history of other diseases of the respiratory system: Secondary | ICD-10-CM | POA: Diagnosis not present

## 2020-03-16 DIAGNOSIS — J310 Chronic rhinitis: Secondary | ICD-10-CM

## 2020-03-16 MED ORDER — OZEMPIC (0.25 OR 0.5 MG/DOSE) 2 MG/1.5ML ~~LOC~~ SOPN: 0.5000 mg | PEN_INJECTOR | SUBCUTANEOUS | 6 refills | Status: DC

## 2020-03-16 NOTE — Progress Notes (Signed)
HPI: Carol Houston is a 50 y.o. female who presents is referred by her PCP Dr. Birdie Riddle, MD  for evaluation of chronic sinus infection.  Patient has had a long history of allergies and sinus infections.  Generally they clear up fairly easily but this last sinus infection persisted for a couple of months.  She was treated with 2 rounds of antibiotics as well as 2 rounds of steroids.  She states that she got it when her son got sick.  She was having a lot of nasal congestion as well as a chronic cough and postnasal drainage.  She has been doing much better over the past week and has had no further yellow-green discharge from her nose.  She has been using azelastine nasal spray at night but has not been using the morning dose as she feels like this makes her tired.  She is presently also taking Singulair.  She has used Flonase in the past but has not been using this recently.  She has also used saline irrigations..  Past Medical History:  Diagnosis Date  . Depression    after father died   . History of gestational diabetes   . Hypertension   . Pregnancy induced hypertension    Past Surgical History:  Procedure Laterality Date  . CARPAL TUNNEL RELEASE    . CESAREAN SECTION N/A 11/28/2014   Procedure: CESAREAN SECTION;  Surgeon: Aloha Gell, MD;  Location: [facility];  Service: Obstetrics;  Laterality: N/A;  . uterine polyps removal     Social History   Socioeconomic History  . Marital status: Married    Spouse name: Not on file  . Number of children: Not on file  . Years of education: Not on file  . Highest education level: Not on file  Occupational History  . Not on file  Tobacco Use  . Smoking status: Never Smoker  . Smokeless tobacco: Never Used  Substance and Sexual Activity  . Alcohol use: Yes    Comment: social  . Drug use: No  . Sexual activity: Yes  Other Topics Concern  . Not on file  Social History Narrative  . Not on file   Social Determinants of Health   Financial  Resource Strain: Not on file  Food Insecurity: Not on file  Transportation Needs: Not on file  Physical Activity: Not on file  Stress: Not on file  Social Connections: Not on file   Family History  Problem Relation Age of Onset  . Diabetes Mother   . Cancer Mother        melanoma  . Heart disease Mother   . Heart attack Mother   . Asthma Father   . Cerebral palsy Brother   . Diabetes Brother   . Cancer Sister        breast   Allergies  Allergen Reactions  . Codeine Hives and Itching   Prior to Admission medications   Medication Sig Start Date End Date Taking? Authorizing Provider  albuterol (VENTOLIN HFA) 108 (90 Base) MCG/ACT inhaler Inhale 2 puffs into the lungs every 6 (six) hours as needed for wheezing or shortness of breath. 01/14/19   Midge Minium, MD  azelastine (ASTELIN) 0.1 % nasal spray Place 2 sprays into both nostrils 2 (two) times daily. Use in each nostril as directed 02/17/20   Midge Minium, MD  losartan (COZAAR) 50 MG tablet TAKE 1 TABLET BY MOUTH EVERY DAY 12/27/19   Midge Minium, MD  metFORMIN (GLUCOPHAGE) 500 MG  tablet TAKE 1 TABLET (500 MG TOTAL) BY MOUTH 2 (TWO) TIMES DAILY. PT TAKES FOR PCOS 09/09/19   Midge Minium, MD  montelukast (SINGULAIR) 10 MG tablet Take 1 tablet (10 mg total) by mouth at bedtime. 02/17/20   Midge Minium, MD  Semaglutide,0.25 or 0.5MG /DOS, (OZEMPIC, 0.25 OR 0.5 MG/DOSE,) 2 MG/1.5ML SOPN Inject 0.5 mg into the skin once a week. 03/16/20   Midge Minium, MD  Vitamin D, Ergocalciferol, (DRISDOL) 1.25 MG (50000 UNIT) CAPS capsule Take 1 capsule (50,000 Units total) by mouth every 7 (seven) days. 01/28/20   Midge Minium, MD     Positive ROS: Otherwise negative  All other systems have been reviewed and were otherwise negative with the exception of those mentioned in the HPI and as above.  Physical Exam: Constitutional: Alert, well-appearing, no acute distress Ears: External ears without lesions  or tenderness. Ear canals are clear bilaterally with intact, clear TMs.  Nasal: External nose without lesions. Septum slightly deviated to the left with moderate rhinitis.  After decongesting the nose on anterior rhinoscopy both middle meatus regions were clear with no evidence of mucopurulent discharge.  No polyps noted.. Clear nasal passages otherwise. Oral: Lips and gums without lesions. Tongue and palate mucosa without lesions. Posterior oropharynx clear. Neck: No palpable adenopathy or masses Respiratory: Breathing comfortably  Skin: No facial/neck lesions or rash noted.  Procedures  Assessment: Chronic rhinitis with history of sinusitis that is responded to previous treatment well.  Plan: Reviewed the medications with the patient in the office today. Azelastine which is a antihistamine type nasal spray should be fine to use especially when allergies kick up in the spring. Recommended regular use of Flonase 2 sprays each nostril at night as this should help some with nasal congestion as well as preventing recurrent sinus obstruction.  And suggested using saline rinse as needed excessive mucus production. Singulair should be beneficial and she can continue with this if desired. She will follow-up here for recheck next time she has a bad sinus infection.   Radene Journey, MD   CC:

## 2020-03-19 ENCOUNTER — Other Ambulatory Visit: Payer: Self-pay | Admitting: Family Medicine

## 2020-03-26 ENCOUNTER — Other Ambulatory Visit: Payer: Self-pay | Admitting: Family Medicine

## 2020-03-26 DIAGNOSIS — I10 Essential (primary) hypertension: Secondary | ICD-10-CM

## 2020-04-01 ENCOUNTER — Encounter: Payer: Self-pay | Admitting: Family Medicine

## 2020-04-01 ENCOUNTER — Other Ambulatory Visit: Payer: Self-pay | Admitting: Emergency Medicine

## 2020-04-09 ENCOUNTER — Ambulatory Visit (AMBULATORY_SURGERY_CENTER): Payer: Self-pay

## 2020-04-09 ENCOUNTER — Other Ambulatory Visit: Payer: Self-pay

## 2020-04-09 VITALS — Ht 66.0 in | Wt 252.0 lb

## 2020-04-09 DIAGNOSIS — Z1211 Encounter for screening for malignant neoplasm of colon: Secondary | ICD-10-CM

## 2020-04-09 MED ORDER — PLENVU 140 G PO SOLR: 1.0000 | ORAL | 0 refills | Status: DC

## 2020-04-09 NOTE — Progress Notes (Signed)
No egg or soy allergy known to patient  No issues with past sedation with any surgeries or procedures No intubation problems in the past  No FH of Malignant Hyperthermia No diet pills per patient No home 02 use per patient  No blood thinners per patient  Pt denies issues with constipation  No A fib or A flutter  EMMI video via MyChart  COVID 19 guidelines implemented in PV today with Pt and RN  Pt is fully vaccinated  for Covid  Pt denies loose or missing teeth, dentures, partials, dental implants, or bonded teeth; Patient reports crowns; Coupon given to pt in PV today, Code to Pharmacy and  NO PA's for preps discussed with pt in PV today  Discussed with pt there will be an out-of-pocket cost for prep and that varies from $0 to 70 dollars  Due to the COVID-19 pandemic we are asking patients to follow certain guidelines.  Pt aware of COVID protocols and [facility] guidelines

## 2020-04-13 ENCOUNTER — Encounter: Payer: Self-pay | Admitting: Internal Medicine

## 2020-04-24 ENCOUNTER — Encounter: Payer: Self-pay | Admitting: Gastroenterology

## 2020-04-24 ENCOUNTER — Ambulatory Visit (AMBULATORY_SURGERY_CENTER): Payer: BC Managed Care – PPO | Admitting: Gastroenterology

## 2020-04-24 ENCOUNTER — Other Ambulatory Visit: Payer: Self-pay

## 2020-04-24 VITALS — BP 126/76 | HR 84 | Temp 98.7°F | Resp 20 | Ht 66.0 in | Wt 252.0 lb

## 2020-04-24 DIAGNOSIS — D123 Benign neoplasm of transverse colon: Secondary | ICD-10-CM | POA: Diagnosis not present

## 2020-04-24 DIAGNOSIS — Z1211 Encounter for screening for malignant neoplasm of colon: Secondary | ICD-10-CM | POA: Diagnosis not present

## 2020-04-24 MED ORDER — SODIUM CHLORIDE 0.9 % IV SOLN: 500.0000 mL | Freq: Once | INTRAVENOUS | Status: DC

## 2020-04-24 NOTE — Op Note (Signed)
[facility] Patient Name: Carol Houston Procedure Date: 04/24/2020 12:17 PM MRN: [id number] Endoscopist: Justice Britain , MD Age: 50 Referring MD:  Date of Birth: 07-Dec-1970 Gender: Female Account #: [id number] Procedure:                Colonoscopy Indications:              Screening for colorectal malignant neoplasm, This                            is the patient's first colonoscopy Medicines:                Monitored Anesthesia Care Procedure:                Pre-Anesthesia Assessment:                           - Prior to the procedure, a History and Physical                            was performed, and patient medications and                            allergies were reviewed. The patient's tolerance of                            previous anesthesia was also reviewed. The risks                            and benefits of the procedure and the sedation                            options and risks were discussed with the patient.                            All questions were answered, and informed consent                            was obtained. Prior Anticoagulants: The patient has                            taken no previous anticoagulant or antiplatelet                            agents. ASA Grade Assessment: II - A patient with                            mild systemic disease. After reviewing the risks                            and benefits, the patient was deemed in                            satisfactory condition to undergo the procedure.  After obtaining informed consent, the colonoscope                            was passed under direct vision. Throughout the                            procedure, the patient's blood pressure, pulse, and                            oxygen saturations were monitored continuously. The                            Olympus CF-HQ190L [id number]) Colonoscope was                            introduced through the anus  and advanced to the the                            cecum, identified by the appendiceal orifice,                            ileocecal valve and palpation. The colonoscopy was                            performed without difficulty. The patient tolerated                            the procedure. The quality of the bowel preparation                            was good. The terminal ileum, ileocecal valve,                            appendiceal orifice, and rectum were photographed. Scope In: 12:24:03 PM Scope Out: 12:38:26 PM Scope Withdrawal Time: 0 hours 10 minutes 11 seconds  Total Procedure Duration: 0 hours 14 minutes 23 seconds  Findings:                 The digital rectal exam findings include                            hemorrhoids. Pertinent negatives include no                            palpable rectal lesions.                           The terminal ileum and ileocecal valve appeared                            normal.                           A 5 mm polyp was found in the hepatic flexure. The  polyp was sessile. The polyp was removed with a                            cold snare. Resection and retrieval were complete.                           Multiple small-mouthed diverticula were found in                            the recto-sigmoid colon and sigmoid colon.                           Normal mucosa was found in the entire colon                            otherwise.                           Non-bleeding non-thrombosed internal hemorrhoids                            were found during retroflexion, during perianal                            exam and during digital exam. The hemorrhoids were                            Grade II (internal hemorrhoids that prolapse but                            reduce spontaneously). Complications:            No immediate complications. Estimated Blood Loss:     Estimated blood loss: none. Estimated blood loss                             was minimal. Impression:               - Hemorrhoids found on digital rectal exam.                           - The examined portion of the ileum was normal.                           - One 5 mm polyp at the hepatic flexure, removed                            with a cold snare. Resected and retrieved.                           - Diverticulosis in the recto-sigmoid colon and in                            the sigmoid colon.                           -  Normal mucosa in the entire examined colon                            otherwise.                           - Non-bleeding non-thrombosed internal hemorrhoids. Recommendation:           - The patient will be observed post-procedure,                            until all discharge criteria are met.                           - Discharge patient to home.                           - Patient has a contact number available for                            emergencies. The signs and symptoms of potential                            delayed complications were discussed with the                            patient. Return to normal activities tomorrow.                            Written discharge instructions were provided to the                            patient.                           - High fiber diet.                           - Use FiberCon 1-2 tablets PO daily.                           - Continue present medications.                           - Await pathology results.                           - Repeat colonoscopy in 06/20/08 years for                            surveillance based on pathology results and                            findings of adenomatous tissue.                           - The findings and recommendations were discussed  with the patient.                           - The findings and recommendations were discussed                            with the patient's family. Justice Britain,  MD 04/24/2020 12:42:50 PM

## 2020-04-24 NOTE — Progress Notes (Signed)
Called to room to assist during endoscopic procedure.  Patient ID and intended procedure confirmed with present staff. Received instructions for my participation in the procedure from the performing physician.  

## 2020-04-24 NOTE — Patient Instructions (Signed)
Handouts on polyps ,diverticulosis,& hemorrhoids & high fiber diet  given to you today  Await pathology results on polyp removed   High fiber diet   Use Fibercon 1-2 tablets by mouth daily   YOU HAD AN ENDOSCOPIC PROCEDURE TODAY AT [facility]:   Refer to the procedure report that was given to you for any specific questions about what was found during the examination.  If the procedure report does not answer your questions, please call your gastroenterologist to clarify.  If you requested that your care partner not be given the details of your procedure findings, then the procedure report has been included in a sealed envelope for you to review at your convenience later.  YOU SHOULD EXPECT: Some feelings of bloating in the abdomen. Passage of more gas than usual.  Walking can help get rid of the air that was put into your GI tract during the procedure and reduce the bloating. If you had a lower endoscopy (such as a colonoscopy or flexible sigmoidoscopy) you may notice spotting of blood in your stool or on the toilet paper. If you underwent a bowel prep for your procedure, you may not have a normal bowel movement for a few days.  Please Note:  You might notice some irritation and congestion in your nose or some drainage.  This is from the oxygen used during your procedure.  There is no need for concern and it should clear up in a day or so.  SYMPTOMS TO REPORT IMMEDIATELY:   Following lower endoscopy (colonoscopy or flexible sigmoidoscopy):  Excessive amounts of blood in the stool  Significant tenderness or worsening of abdominal pains  Swelling of the abdomen that is new, acute  Fever of 100F or higher    For urgent or emergent issues, a gastroenterologist can be reached at any hour by calling [phone]. Do not use MyChart messaging for urgent concerns.    DIET:  We do recommend a small meal at first, but then you may proceed to your regular diet.  Drink plenty  of fluids but you should avoid alcoholic beverages for 24 hours.  ACTIVITY:  You should plan to take it easy for the rest of today and you should NOT DRIVE or use heavy machinery until tomorrow (because of the sedation medicines used during the test).    FOLLOW UP: Our staff will call the number listed on your records 48-72 hours following your procedure to check on you and address any questions or concerns that you may have regarding the information given to you following your procedure. If we do not reach you, we will leave a message.  We will attempt to reach you two times.  During this call, we will ask if you have developed any symptoms of COVID 19. If you develop any symptoms (ie: fever, flu-like symptoms, shortness of breath, cough etc.) before then, please call [phone].  If you test positive for Covid 19 in the 2 weeks post procedure, please call and report this information to [place].    If any biopsies were taken you will be contacted by phone or by letter within the next 1-3 weeks.  Please call us at [phone] if you have not heard about the biopsies in 3 weeks.    SIGNATURES/CONFIDENTIALITY: You and/or your care partner have signed paperwork which will be entered into your electronic medical record.  These signatures attest to the fact that that the information above on your After Visit Summary has  been reviewed and is understood.  Full responsibility of the confidentiality of this discharge information lies with you and/or your care-partner.

## 2020-04-24 NOTE — Progress Notes (Signed)
pt tolerated well. VSS. awake and to recovery. Report given to RN.  

## 2020-04-29 ENCOUNTER — Telehealth: Payer: Self-pay | Admitting: *Deleted

## 2020-04-29 NOTE — Telephone Encounter (Signed)
  Follow up Call-  Call back number 04/24/2020  Post procedure Call Back phone  # [phone]  Permission to leave phone message Yes  Some recent data might be hidden     Patient questions:  Do you have a fever, pain , or abdominal swelling? No. Pain Score  0 *  Have you tolerated food without any problems? Yes.    Have you been able to return to your normal activities? Yes.    Do you have any questions about your discharge instructions: Diet   No. Medications  No. Follow up visit  No.  Do you have questions or concerns about your Care? No.  Actions: * If pain score is 4 or above: No action needed, pain <4.  1. Have you developed a fever since your procedure? no  2.   Have you had an respiratory symptoms (SOB or cough) since your procedure? no  3.   Have you tested positive for COVID 19 since your procedure no  4.   Have you had any family members/close contacts diagnosed with the COVID 19 since your procedure?  no   If yes to any of these questions please route to Joylene John, RN and Joella Prince, RN

## 2020-04-30 DIAGNOSIS — Z6841 Body Mass Index (BMI) 40.0 and over, adult: Secondary | ICD-10-CM | POA: Diagnosis not present

## 2020-04-30 DIAGNOSIS — Z01419 Encounter for gynecological examination (general) (routine) without abnormal findings: Secondary | ICD-10-CM | POA: Diagnosis not present

## 2020-04-30 DIAGNOSIS — Z118 Encounter for screening for other infectious and parasitic diseases: Secondary | ICD-10-CM | POA: Diagnosis not present

## 2020-04-30 DIAGNOSIS — Z01411 Encounter for gynecological examination (general) (routine) with abnormal findings: Secondary | ICD-10-CM | POA: Diagnosis not present

## 2020-04-30 DIAGNOSIS — Z1231 Encounter for screening mammogram for malignant neoplasm of breast: Secondary | ICD-10-CM | POA: Diagnosis not present

## 2020-04-30 DIAGNOSIS — Z124 Encounter for screening for malignant neoplasm of cervix: Secondary | ICD-10-CM | POA: Diagnosis not present

## 2020-04-30 DIAGNOSIS — Z113 Encounter for screening for infections with a predominantly sexual mode of transmission: Secondary | ICD-10-CM | POA: Diagnosis not present

## 2020-04-30 DIAGNOSIS — Z309 Encounter for contraceptive management, unspecified: Secondary | ICD-10-CM | POA: Diagnosis not present

## 2020-04-30 DIAGNOSIS — N762 Acute vulvitis: Secondary | ICD-10-CM | POA: Diagnosis not present

## 2020-05-02 ENCOUNTER — Encounter: Payer: Self-pay | Admitting: Gastroenterology

## 2020-05-11 ENCOUNTER — Other Ambulatory Visit: Payer: Self-pay | Admitting: Family Medicine

## 2020-05-26 DIAGNOSIS — Z3200 Encounter for pregnancy test, result unknown: Secondary | ICD-10-CM | POA: Diagnosis not present

## 2020-05-26 DIAGNOSIS — Z6841 Body Mass Index (BMI) 40.0 and over, adult: Secondary | ICD-10-CM | POA: Diagnosis not present

## 2020-05-26 DIAGNOSIS — Z30430 Encounter for insertion of intrauterine contraceptive device: Secondary | ICD-10-CM | POA: Diagnosis not present

## 2020-06-20 ENCOUNTER — Other Ambulatory Visit: Payer: Self-pay | Admitting: Family Medicine

## 2020-06-20 DIAGNOSIS — I10 Essential (primary) hypertension: Secondary | ICD-10-CM

## 2020-06-25 DIAGNOSIS — Z30431 Encounter for routine checking of intrauterine contraceptive device: Secondary | ICD-10-CM | POA: Diagnosis not present

## 2020-07-11 ENCOUNTER — Other Ambulatory Visit: Payer: Self-pay | Admitting: Family Medicine

## 2020-08-12 ENCOUNTER — Encounter: Payer: Self-pay | Admitting: *Deleted

## 2020-08-20 ENCOUNTER — Encounter: Payer: Self-pay | Admitting: Family Medicine

## 2020-08-25 DIAGNOSIS — J1282 Pneumonia due to coronavirus disease 2019: Secondary | ICD-10-CM | POA: Diagnosis not present

## 2020-08-25 DIAGNOSIS — R0602 Shortness of breath: Secondary | ICD-10-CM | POA: Diagnosis not present

## 2020-08-25 DIAGNOSIS — U071 COVID-19: Secondary | ICD-10-CM | POA: Diagnosis not present

## 2020-09-03 ENCOUNTER — Other Ambulatory Visit: Payer: Self-pay

## 2020-09-03 ENCOUNTER — Telehealth: Payer: BC Managed Care – PPO | Admitting: Family Medicine

## 2020-09-03 VITALS — HR 94 | Temp 98.0°F

## 2020-09-03 DIAGNOSIS — J189 Pneumonia, unspecified organism: Secondary | ICD-10-CM | POA: Diagnosis not present

## 2020-09-03 MED ORDER — CEFDINIR 300 MG PO CAPS
300.0000 mg | ORAL_CAPSULE | Freq: Two times a day (BID) | ORAL | 0 refills | Status: DC
Start: 2020-09-03 — End: 2020-12-10

## 2020-09-03 NOTE — Progress Notes (Signed)
[place] at [facility] [address] [place], [place] [address] [phone]  Date:  09/03/2020   Name:  Carol Houston   DOB:  27-Feb-1970   MRN:  [id number]  PCP:  Midge Minium, MD    Chief Complaint: No chief complaint on file.   History of Present Illness:  Carol Houston is a 50 y.o. very pleasant female patient who presents with the following:  Primary patient of Dr. Birdie Riddle, virtual visit today for concern of illness Medical history includes obesity, hypertension, insulin resistance Current medications include metformin, losartan, Ozempic, Singulair She had 3 doses of COVID-19 vaccine She tested positive for COVID-19 on July 7 She ended [facility]-she was diagnosed with right lower lobe pneumonia. She was treated with albuterol-no antibiotics were used, with the thought being that her pneumonia was viral  Today pt notes that she still does not feel well She has a cough She feels tired- aches have now resolved She has not been checking her temp the last 36 hours, but 2 days ago her temp was about 100 Today is Thursday-she returned to work this past Monday- felt tired Tuesday at work she felt fine- but Tuesday night she started to get sick again Today she just feels tired out and not quite herself  No change of pregnancy and she is not nursing   Chest x-ray from 7/12 08/25/2020 4:51 PM EDT   Findings:  Frontal and lateral views of the chest, no prior studies.  Patchy right lower lobe airspace disease without effusion.  Left lung without acute consolidation or effusion. No pneumothorax. There is no pulmonary venous congestion or interstitial edema. The cardiomediastinal silhouette size is within normal limits.   Impression: Right lower lobe opacity given the clinical history likely represents acute infectious process.  Recommend follow-up after treatment to ensure resolution. Electronically signed  by: Zetta Bills. Delphia Grates, M.D., DABR on 08/25/2020 16:51:54   Patient Active Problem List   Diagnosis Date Noted   Insulin resistance 01/27/2020   Morbid obesity ([place]) 10/04/2019   Essential hypertension 10/04/2019   PCOS (polycystic ovarian syndrome) 11/08/2017   Physical exam 05/15/2015   HEADACHE 10/27/2008    Past Medical History:  Diagnosis Date   Allergy    seasonal allergies   Depression    after father died -hx   History of gestational diabetes    Hypertension    on meds   PCOS (polycystic ovarian syndrome)    Pregnancy induced hypertension     Past Surgical History:  Procedure Laterality Date   CARPAL TUNNEL RELEASE     CESAREAN SECTION N/A 11/28/2014   Procedure: CESAREAN SECTION;  Surgeon: Aloha Gell, MD;  Location: [facility];  Service: Obstetrics;  Laterality: N/A;   uterine polyps removal     WISDOM TOOTH EXTRACTION      Social History   Tobacco Use   Smoking status: Never   Smokeless tobacco: Never  Vaping Use   Vaping Use: Never used  Substance Use Topics   Alcohol use: Not Currently    Comment: social   Drug use: No    Family History  Problem Relation Age of Onset   Diabetes Mother    Heart disease Mother    Heart attack Mother    Melanoma Mother 30   Asthma Father    Colon polyps Father 39   Cerebral palsy Brother    Diabetes Brother  Breast cancer Sister 42   Colon cancer Neg Hx    Esophageal cancer Neg Hx    Rectal cancer Neg Hx    Stomach cancer Neg Hx     Allergies  Allergen Reactions   Codeine Hives and Itching    Medication list has been reviewed and updated.  Current Outpatient Medications on File Prior to Visit  Medication Sig Dispense Refill   albuterol (VENTOLIN HFA) 108 (90 Base) MCG/ACT inhaler Inhale 2 puffs into the lungs every 6 (six) hours as needed for wheezing or shortness of breath. 18 g 1   azelastine (ASTELIN) 0.1 % nasal spray Place 2 sprays into both nostrils 2 (two) times daily. Use in each nostril as  directed (Patient taking differently: Place 2 sprays into both nostrils 2 (two) times daily as needed. Use in each nostril as directed) 30 mL 12   losartan (COZAAR) 50 MG tablet TAKE 1 TABLET BY MOUTH EVERY DAY 90 tablet 0   metFORMIN (GLUCOPHAGE) 500 MG tablet TAKE 1 TABLET (500 MG TOTAL) BY MOUTH 2 (TWO) TIMES DAILY. PT TAKES FOR PCOS 180 tablet 1   montelukast (SINGULAIR) 10 MG tablet TAKE 1 TABLET BY MOUTH EVERYDAY AT BEDTIME 90 tablet 1   OZEMPIC, 0.25 OR 0.5 MG/DOSE, 2 MG/1.5ML SOPN INJECT 0.25 MG INTO THE SKIN ONCE A WEEK FOR 28 DAYS, THEN 0.5 MG ONCE A WEEK FOR 28 DAYS. 1.5 mL 3   Vitamin D, Ergocalciferol, (DRISDOL) 1.25 MG (50000 UNIT) CAPS capsule Take 1 capsule (50,000 Units total) by mouth every 7 (seven) days. 12 capsule 0   No current facility-administered medications on file prior to visit.    Review of Systems:  As per HPI- otherwise negative.   Physical Examination: There were no vitals filed for this visit. There were no vitals filed for this visit. There is no height or weight on file to calculate BMI. Ideal Body Weight:    Not checking her BP at home Patient observed via video monitor.  She looks well, no distress, no cough or shortness of breath is noted  Assessment and Plan: Community acquired pneumonia, unspecified laterality - Plan: cefdinir (OMNICEF) 300 MG capsule Virtual visit today to discuss community-acquired pneumonia.  Patient was diagnosed with COVID-19 2 weeks ago.  Just over 1 week ago she went to an urgent care and had a chest x-ray.  This did show an infiltrate but antibiotics were not used, thinking that she had a viral pneumonia.  She started to feel better but then relapsed the last 48 hours.  No distress but she does not feel well.  I will treat her presumptively for community-acquired pneumonia versus bronchitis with Omnicef.  She is advised to seek care if not improving in the next 1 to 2 days, sooner if getting worse  I also advised her that a  repeat chest x-ray is recommended.  I would suggest having her repeat chest x-ray in about 6 weeks  Patient states understanding and agreement of our plan.  Video used for the duration of visit today  Signed Lamar Blinks, MD

## 2020-09-11 ENCOUNTER — Other Ambulatory Visit: Payer: Self-pay | Admitting: Family Medicine

## 2020-09-18 ENCOUNTER — Other Ambulatory Visit: Payer: Self-pay | Admitting: Family Medicine

## 2020-09-18 DIAGNOSIS — I10 Essential (primary) hypertension: Secondary | ICD-10-CM

## 2020-11-17 ENCOUNTER — Other Ambulatory Visit: Payer: Self-pay | Admitting: Family Medicine

## 2020-12-10 ENCOUNTER — Encounter: Payer: Self-pay | Admitting: Family Medicine

## 2020-12-10 ENCOUNTER — Other Ambulatory Visit: Payer: Self-pay

## 2020-12-10 ENCOUNTER — Telehealth (INDEPENDENT_AMBULATORY_CARE_PROVIDER_SITE_OTHER): Payer: BC Managed Care – PPO | Admitting: Family Medicine

## 2020-12-10 DIAGNOSIS — J189 Pneumonia, unspecified organism: Secondary | ICD-10-CM | POA: Diagnosis not present

## 2020-12-10 DIAGNOSIS — B9689 Other specified bacterial agents as the cause of diseases classified elsewhere: Secondary | ICD-10-CM

## 2020-12-10 DIAGNOSIS — R0781 Pleurodynia: Secondary | ICD-10-CM

## 2020-12-10 DIAGNOSIS — J329 Chronic sinusitis, unspecified: Secondary | ICD-10-CM | POA: Diagnosis not present

## 2020-12-10 MED ORDER — AMOXICILLIN-POT CLAVULANATE 875-125 MG PO TABS: 1.0000 | ORAL_TABLET | Freq: Two times a day (BID) | ORAL | 0 refills | Status: DC

## 2020-12-10 MED ORDER — PROMETHAZINE-DM 6.25-15 MG/5ML PO SYRP: 5.0000 mL | ORAL_SOLUTION | Freq: Four times a day (QID) | ORAL | 0 refills | Status: DC | PRN

## 2020-12-10 NOTE — Progress Notes (Signed)
Virtual Visit via Video   I connected with patient on 12/10/20 at 11:30 AM EDT by a video enabled telemedicine application and verified that I am speaking with the correct person using two identifiers.  Location patient: Home Location provider: [facility], Office Persons participating in the virtual visit: Patient, Provider, [place] (Sabrina M)  I discussed the limitations of evaluation and management by telemedicine and the availability of in person appointments. The patient expressed understanding and agreed to proceed.  Subjective:   HPI:   URI- sxs started ~2 weeks ago w/ nasal congestion.  Negative COVID test.  Sxs worsened and took 2nd COVID test and was also negative.  Saturday night had a fever.  Starting this morning developed knife like pain in L shoulder when taking deep breath.  Cough is productive- particularly in the AM.  Nasal congestion/drainage- green and bloody.  + facial pain.  No tooth pain.  + HA.  Pt has had progressive SOB for last 3-4 days.  Denies palpitations or racing heart.  Pt reports this would be her 3rd PNA.  ROS:   See pertinent positives and negatives per HPI.  Patient Active Problem List   Diagnosis Date Noted   Insulin resistance 01/27/2020   Morbid obesity ([place]) 10/04/2019   Essential hypertension 10/04/2019   PCOS (polycystic ovarian syndrome) 11/08/2017   Physical exam 05/15/2015   HEADACHE 10/27/2008    Social History   Tobacco Use   Smoking status: Never   Smokeless tobacco: Never  Substance Use Topics   Alcohol use: Not Currently    Comment: social    Current Outpatient Medications:    albuterol (VENTOLIN HFA) 108 (90 Base) MCG/ACT inhaler, Inhale 2 puffs into the lungs every 6 (six) hours as needed for wheezing or shortness of breath., Disp: 18 g, Rfl: 1   azelastine (ASTELIN) 0.1 % nasal spray, Place 2 sprays into both nostrils 2 (two) times daily. Use in each nostril as directed (Patient taking differently: Place 2 sprays  into both nostrils 2 (two) times daily as needed. Use in each nostril as directed), Disp: 30 mL, Rfl: 12   losartan (COZAAR) 50 MG tablet, TAKE 1 TABLET BY MOUTH EVERY DAY, Disp: 90 tablet, Rfl: 0   metFORMIN (GLUCOPHAGE) 500 MG tablet, TAKE 1 TABLET (500 MG TOTAL) BY MOUTH 2 (TWO) TIMES DAILY. PT TAKES FOR PCOS, Disp: 180 tablet, Rfl: 1   OZEMPIC, 0.25 OR 0.5 MG/DOSE, 2 MG/1.5ML SOPN, INJECT 0.25 MG INTO THE SKIN ONCE A WEEK FOR 28 DAYS, THEN 0.5 MG ONCE A WEEK FOR 28 DAYS., Disp: 1.5 mL, Rfl: 3   cefdinir (OMNICEF) 300 MG capsule, Take 1 capsule (300 mg total) by mouth 2 (two) times daily. (Patient not taking: Reported on 12/10/2020), Disp: 20 capsule, Rfl: 0   montelukast (SINGULAIR) 10 MG tablet, TAKE 1 TABLET BY MOUTH EVERYDAY AT BEDTIME (Patient not taking: Reported on 12/10/2020), Disp: 90 tablet, Rfl: 1   Vitamin D, Ergocalciferol, (DRISDOL) 1.25 MG (50000 UNIT) CAPS capsule, Take 1 capsule (50,000 Units total) by mouth every 7 (seven) days., Disp: 12 capsule, Rfl: 0  Allergies  Allergen Reactions   Codeine Hives and Itching    Objective:   There were no vitals taken for this visit. AAOx3, NAD NCAT, EOMI No obvious CN deficits Coloring WNL Pt is able to speak clearly, coherently without shortness of breath or increased work of breathing.  Audible congestion Thought process is linear.  Mood is appropriate.   Assessment and Plan:  Pleuritic chest pain- new.  Pt has hx of LLL PNA and given 2 weeks of respiratory sxs, some fever, productive cough, and now referred pleuritic pain PNA is highly likely.  Start Augmentin and cough syrup.  Did caution patient that pleuritic CP can also be PE but HR is normal and no SOB at rest.  She is aware that if sxs fail to improve or worsen, she needs to let me know or go to the ER.  Pt expressed understanding and is in agreement w/ plan.   Bacterial Sinusitis- start Augmentin as above.  Reviewed how to take and possible side effects.  Pt expressed  understanding and is in agreement w/ plan.    Annye Asa, MD 12/10/2020

## 2020-12-10 NOTE — Progress Notes (Signed)
I connected with  Carol Houston on 12/10/20 by a video enabled telemedicine application and verified that I am speaking with the correct person using two identifiers.   I discussed the limitations of evaluation and management by telemedicine. The patient expressed understanding and agreed to proceed.

## 2020-12-24 ENCOUNTER — Other Ambulatory Visit: Payer: Self-pay | Admitting: Family Medicine

## 2020-12-24 DIAGNOSIS — I10 Essential (primary) hypertension: Secondary | ICD-10-CM

## 2021-01-26 ENCOUNTER — Encounter: Payer: Self-pay | Admitting: Family Medicine

## 2021-01-27 ENCOUNTER — Ambulatory Visit (INDEPENDENT_AMBULATORY_CARE_PROVIDER_SITE_OTHER): Payer: BC Managed Care – PPO | Admitting: Family Medicine

## 2021-01-27 ENCOUNTER — Encounter: Payer: Self-pay | Admitting: Family Medicine

## 2021-01-27 VITALS — BP 128/82 | HR 92 | Temp 97.7°F | Resp 16 | Ht 66.0 in | Wt 251.8 lb

## 2021-01-27 DIAGNOSIS — R42 Dizziness and giddiness: Secondary | ICD-10-CM | POA: Insufficient documentation

## 2021-01-27 DIAGNOSIS — R55 Syncope and collapse: Secondary | ICD-10-CM

## 2021-01-27 DIAGNOSIS — Z0000 Encounter for general adult medical examination without abnormal findings: Secondary | ICD-10-CM

## 2021-01-27 MED ORDER — SEMAGLUTIDE (1 MG/DOSE) 4 MG/3ML ~~LOC~~ SOPN: 1.0000 mg | PEN_INJECTOR | SUBCUTANEOUS | 3 refills | Status: DC

## 2021-01-27 NOTE — Progress Notes (Signed)
Cardiology Office Note   Date:  01/28/2021   ID:  Carol Houston, DOB 1970-09-07, MRN [id number]  PCP:  Midge Minium, MD  Cardiologist:   Minus Breeding, MD Referring:  Midge Minium, MD  No chief complaint on file.     History of Present Illness: Carol Houston is a 50 y.o. female who presents for evaluation of dizziness and palpitations.    She has no past cardiac history.  However, her mom had bypass in her early 23s.  The patient has not had no prior cardiac testing.  She has had dizziness for about 2 months.  This happens at rest.  It happens when she is sitting and never when she is up and moving around.  In fact that she gets up to move around it will go away.  It is associated with a presyncope but she has not actually passed out.  She thinks is happening with increased frequency and now almost daily.  She does not feel palpitations with this.  What made her nervous and made her come here was that 2 days ago her heart was flipping.  Was lasting for a few seconds.  She did not have this before.  She did not describe a prolonged tachyarrhythmia but felt it skipped and felt for several weeks.  Of note the patient is not particularly active.  She vacuums or climbs stairs.  She does not really have symptoms with this other than dyspnea that she blames on being out of shape.  She does not describe PND or orthopnea.  She has had no chest pressure, neck or arm discomfort.   Past Medical History:  Diagnosis Date   Allergy    seasonal allergies   Depression    after father died -hx   History of gestational diabetes    Hypertension    on meds   PCOS (polycystic ovarian syndrome)    Pregnancy induced hypertension     Past Surgical History:  Procedure Laterality Date   CARPAL TUNNEL RELEASE     CESAREAN SECTION N/A 11/28/2014   Procedure: CESAREAN SECTION;  Surgeon: Aloha Gell, MD;  Location: [facility];  Service: Obstetrics;  Laterality: N/A;   uterine polyps removal      WISDOM TOOTH EXTRACTION       Current Outpatient Medications  Medication Sig Dispense Refill   albuterol (VENTOLIN HFA) 108 (90 Base) MCG/ACT inhaler Inhale 2 puffs into the lungs every 6 (six) hours as needed for wheezing or shortness of breath. 18 g 1   azelastine (ASTELIN) 0.1 % nasal spray Place 2 sprays into both nostrils 2 (two) times daily. Use in each nostril as directed (Patient taking differently: Place 2 sprays into both nostrils 2 (two) times daily as needed. Use in each nostril as directed) 30 mL 12   losartan (COZAAR) 50 MG tablet TAKE 1 TABLET BY MOUTH EVERY DAY 90 tablet 0   metFORMIN (GLUCOPHAGE) 500 MG tablet TAKE 1 TABLET (500 MG TOTAL) BY MOUTH 2 (TWO) TIMES DAILY. PT TAKES FOR PCOS 180 tablet 1   Nutritional Supplements (VITAMIN D MAINTENANCE PO) Take by mouth.     Semaglutide, 1 MG/DOSE, 4 MG/3ML SOPN Inject 1 mg as directed once a week. 3 mL 3   No current facility-administered medications for this visit.    Allergies:   Codeine    Social History:  The patient  reports that she has never smoked. She has never used smokeless tobacco. She reports that she does  not currently use alcohol. She reports that she does not use drugs.   Family History:  The patient's family history includes Asthma in her father; Breast cancer (age of onset: 100) in her sister; Cerebral palsy in her brother; Colon polyps (age of onset: 69) in her father; Diabetes in her brother and mother; Heart attack (age of onset: 59) in her mother; Heart disease in her mother; Melanoma (age of onset: 25) in her mother.    ROS:  Please see the history of present illness.   Otherwise, review of systems are positive for none.   All other systems are reviewed and negative.    PHYSICAL EXAM: VS:  BP (!) 142/96    Pulse 92    Ht 5\' 6"  (1.676 m)    Wt 251 lb (113.9 kg)    SpO2 99%    BMI 40.51 kg/m  , BMI Body mass index is 40.51 kg/m. GENERAL:  Well appearing HEENT:  Pupils equal round and reactive, fundi not  visualized, oral mucosa unremarkable NECK:  No jugular venous distention, waveform within normal limits, carotid upstroke brisk and symmetric, no bruits, no thyromegaly LYMPHATICS:  No cervical, inguinal adenopathy LUNGS:  Clear to auscultation bilaterally BACK:  No CVA tenderness CHEST:  Unremarkable HEART:  PMI not displaced or sustained,S1 and S2 within normal limits, no S3, no S4, no clicks, no rubs, no murmurs ABD:  Flat, positive bowel sounds normal in frequency in pitch, no bruits, no rebound, no guarding, no midline pulsatile mass, no hepatomegaly, no splenomegaly EXT:  2 plus pulses throughout, no edema, no cyanosis no clubbing SKIN:  No rashes no nodules NEURO:  Cranial nerves II through XII grossly intact, motor grossly intact throughout PSYCH:  Cognitively intact, oriented to person place and time    EKG:  EKG is ordered today. The ekg ordered today demonstrates sinus rhythm, rate 92, axis within normal limits, intervals within normal limits, no acute ST-T wave changes.   Recent Labs: No results found for requested labs within last 8760 hours.    Lipid Panel    Component Value Date/Time   CHOL 142 01/27/2020 0840   TRIG 91.0 01/27/2020 0840   HDL 67.30 01/27/2020 0840   CHOLHDL 2 01/27/2020 0840   VLDL 18.2 01/27/2020 0840   LDLCALC 57 01/27/2020 0840   LDLDIRECT 81.0 11/08/2017 1023      Wt Readings from Last 3 Encounters:  01/28/21 251 lb (113.9 kg)  01/27/21 251 lb 12.8 oz (114.2 kg)  04/24/20 252 lb (114.3 kg)      Other studies Reviewed: Additional studies/ records that were reviewed today include: Primary care records. Review of the above records demonstrates:  Please see elsewhere in the note.     ASSESSMENT AND PLAN:  DIZZINESS:   Patient has dizziness and palpitations.  I am not sure that the 2 are related.  I will check blood work to include a TSH and electrolytes.  I will check a 2-week event monitor.  HTN: Her blood pressure is controlled.   Continue the meds as listed.  DM: I will check an A1c  FAMILY HISTORY OF EARLY CAD: Given her family history I would like to screen her with a coronary calcium score.    Current medicines are reviewed at length with the patient today.  The patient does not have concerns regarding medicines.  The following changes have been made:  no change  Labs/ tests ordered today include:   Orders Placed This Encounter  Procedures  CT CARDIAC SCORING (SELF PAY ONLY)   Hemoglobin A1c   CBC   Comprehensive metabolic panel   Lipid panel   TSH   VITAMIN D 25 Hydroxy (Vit-D Deficiency, Fractures)   LONG TERM MONITOR (3-14 DAYS)   EKG 12-Lead     Disposition:   FU with me as needed.     Signed, Minus Breeding, MD  01/28/2021 8:48 AM    [place]

## 2021-01-27 NOTE — Progress Notes (Signed)
Subjective:    Patient ID: Carol Houston, female    DOB: [birth year], 50 y.o.   MRN: [id number]  HPI CPE- UTD on colonoscopy, Tdap, flu.  UTD on mammo and pap  Patient Care Team    Relationship Specialty Notifications Start End  Midge Minium, MD PCP - General   01/27/10   Aloha Gell, MD Consulting Physician Obstetrics and Gynecology  04/27/16     Health Maintenance  Topic Date Due   Pneumococcal Vaccine 31-63 Years old (1 - PCV) Never done   COVID-19 Vaccine (4 - Booster for Moderna series) 05/08/2020   PAP SMEAR-Modifier  12/14/2020   Hepatitis C Screening  12/10/2021 (Originally 02/06/1991)   TETANUS/TDAP  09/17/2024   COLONOSCOPY (Pts 45-29yrs Insurance coverage will need to be confirmed)  04/25/2027   INFLUENZA VACCINE  Completed   HIV Screening  Completed   HPV VACCINES  Aged Out      Review of Systems Patient reports no vision/ hearing changes, adenopathy,fever, weight change,  persistant/recurrent hoarseness , swallowing issues, chest pain, edema, persistant/recurrent cough, hemoptysis, dyspnea (rest/exertional/paroxysmal nocturnal), gastrointestinal bleeding (melena, rectal bleeding), abdominal pain, significant heartburn, bowel changes, GU symptoms (dysuria, hematuria, incontinence), Gyn symptoms (abnormal  bleeding, pain),  syncope, focal weakness, memory loss, numbness & tingling, skin/hair/nail changes, abnormal bruising or bleeding, anxiety, or depression.   + intermittent palpitations + pre-syncope- pt reports feeling like she's going to pass out and the situation will resolve if she stands up or otherwise moves quickly.  Has had 8-9 episodes in the last 6 weeks.  Eating regularly.  Drinking water.  This visit occurred during the SARS-CoV-2 public health emergency.  Safety protocols were in place, including screening questions prior to the visit, additional usage of staff PPE, and extensive cleaning of exam room while observing appropriate contact time as  indicated for disinfecting solutions.      Objective:   Physical Exam General Appearance:    Alert, cooperative, no distress, appears stated age, obese  Head:    Normocephalic, without obvious abnormality, atraumatic  Eyes:    PERRL, conjunctiva/corneas clear, EOM's intact, fundi    benign, both eyes  Ears:    Normal TM's and external ear canals, both ears  Nose:   Deferred due to COVID  Throat:   Neck:   Supple, symmetrical, trachea midline, no adenopathy;    Thyroid: no enlargement/tenderness/nodules  Back:     Symmetric, no curvature, ROM normal, no CVA tenderness  Lungs:     Clear to auscultation bilaterally, respirations unlabored  Chest Wall:    No tenderness or deformity   Heart:    Regular rate and rhythm, S1 and S2 normal, no murmur, rub   or gallop  Breast Exam:    Deferred to GYN  Abdomen:     Soft, non-tender, bowel sounds active all four quadrants,    no masses, no organomegaly  Genitalia:    Deferred to GYN  Rectal:    Extremities:   Extremities normal, atraumatic, no cyanosis or edema  Pulses:   2+ and symmetric all extremities  Skin:   Skin color, texture, turgor normal, no rashes or lesions  Lymph nodes:   Cervical, supraclavicular, and axillary nodes normal  Neurologic:   CNII-XII intact, normal strength, sensation and reflexes    throughout          Assessment & Plan:   Pre-syncope- new.  Pt reports situations will occur both at home and at work.  Can occur while sitting or  standing.  States feeling will resolve if she stands up or otherwise moves quickly- almost like 'snapping out of it'.  States she is eating and drinking normally.  Has had 8-9 episodes in last 6 weeks.  Will refer to cardiology for complete evaluation as she at times also has palpitations.  Reviewed supportive care and red flags that should prompt return.  Pt expressed understanding and is in agreement w/ plan.

## 2021-01-27 NOTE — Patient Instructions (Addendum)
Follow up in 6 months to recheck BP We'll notify you of your lab results and make any changes if needed We'll call you with your Cardiology appt Make sure you are drinking plenty of water Make sure you are eating regularly to avoid your blood sugar dropping low Continue to work on healthy diet and regular exercise- you can do it!! Increase the Ozempic to 1mg  weekly- 2 of what you have at home and then we'll use the new higher dose Call with any questions or concerns Stay Safe!  Stay Healthy! Happy Holidays!!!

## 2021-01-28 ENCOUNTER — Ambulatory Visit (INDEPENDENT_AMBULATORY_CARE_PROVIDER_SITE_OTHER): Payer: BC Managed Care – PPO

## 2021-01-28 ENCOUNTER — Ambulatory Visit: Payer: BC Managed Care – PPO | Admitting: Cardiology

## 2021-01-28 ENCOUNTER — Encounter: Payer: Self-pay | Admitting: Cardiology

## 2021-01-28 ENCOUNTER — Other Ambulatory Visit: Payer: Self-pay

## 2021-01-28 VITALS — BP 142/96 | HR 92 | Ht 66.0 in | Wt 251.0 lb

## 2021-01-28 DIAGNOSIS — I10 Essential (primary) hypertension: Secondary | ICD-10-CM

## 2021-01-28 DIAGNOSIS — E8881 Metabolic syndrome: Secondary | ICD-10-CM | POA: Diagnosis not present

## 2021-01-28 DIAGNOSIS — R42 Dizziness and giddiness: Secondary | ICD-10-CM

## 2021-01-28 DIAGNOSIS — E88819 Insulin resistance, unspecified: Secondary | ICD-10-CM

## 2021-01-28 NOTE — Patient Instructions (Signed)
Medication Instructions:  Your Physician recommend you continue on your current medication as directed.    *If you need a refill on your cardiac medications before your next appointment, please call your pharmacy*   Lab Work: today A1C, Vit D, CBC, CMET, Lipid, TSH If you have labs (blood work) drawn today and your tests are completely normal, you will receive your results only by: [place] (if you have MyChart) OR A paper copy in the mail If you have any lab test that is abnormal or we need to change your treatment, we will call you to review the results.   Testing/Procedures: Dr. Vita Barley, MD has ordered a CT coronary calcium score. This test is done at [address]. This is $99 out of pocket.   Coronary CalciumScan A coronary calcium scan is an imaging test used to look for deposits of calcium and other fatty materials (plaques) in the inner lining of the blood vessels of the heart (coronary arteries). These deposits of calcium and plaques can partly clog and narrow the coronary arteries without producing any symptoms or warning signs. This puts a person at risk for a heart attack. This test can detect these deposits before symptoms develop. Tell a health care provider about: Any allergies you have. All medicines you are taking, including vitamins, herbs, eye drops, creams, and over-the-counter medicines. Any problems you or family members have had with anesthetic medicines. Any blood disorders you have. Any surgeries you have had. Any medical conditions you have. Whether you are pregnant or may be pregnant. What are the risks? Generally, this is a safe procedure. However, problems may occur, including: Harm to a pregnant woman and her unborn baby. This test involves the use of radiation. Radiation exposure can be dangerous to a pregnant woman and her unborn baby. If you are pregnant, you generally should not have this procedure done. Slight increase in  the risk of cancer. This is because of the radiation involved in the test. What happens before the procedure? No preparation is needed for this procedure. What happens during the procedure? You will undress and remove any jewelry around your neck or chest. You will put on a hospital gown. Sticky electrodes will be placed on your chest. The electrodes will be connected to an electrocardiogram (ECG) machine to record a tracing of the electrical activity of your heart. A CT scanner will take pictures of your heart. During this time, you will be asked to lie still and hold your breath for 2-3 seconds while a picture of your heart is being taken. The procedure may vary among health care providers and hospitals. What happens after the procedure? You can get dressed. You can return to your normal activities. It is up to you to get the results of your test. Ask your health care provider, or the department that is doing the test, when your results will be ready. Summary A coronary calcium scan is an imaging test used to look for deposits of calcium and other fatty materials (plaques) in the inner lining of the blood vessels of the heart (coronary arteries). Generally, this is a safe procedure. Tell your health care provider if you are pregnant or may be pregnant. No preparation is needed for this procedure. A CT scanner will take pictures of your heart. You can return to your normal activities after the scan is done. This information is not intended to replace advice given to you by your health care provider. Make sure  you discuss any questions you have with your health care provider. Document Released: 07/30/2007 Document Revised: 12/21/2015 Document Reviewed: 12/21/2015 Elsevier Interactive Patient Education  2017 [employer] Monitor Instructions  Your physician has requested you wear a ZIO patch monitor for 14 days.  This is a single patch monitor. [employer] supplies one patch  monitor per enrollment. Additional stickers are not available. Please do not apply patch if you will be having a Nuclear Stress Test,  Echocardiogram, Cardiac CT, MRI, or Chest Xray during the period you would be wearing the  monitor. The patch cannot be worn during these tests. You cannot remove and re-apply the  ZIO XT patch monitor.  Your ZIO patch monitor will be mailed 3 day USPS to your address on file. It may take 3-5 days  to receive your monitor after you have been enrolled.  Once you have received your monitor, please review the enclosed instructions. Your monitor  has already been registered assigning a specific monitor serial # to you.  Billing and Patient Assistance Program Information  We have supplied [employer] with any of your insurance information on file for billing purposes. [employer] offers a sliding scale Patient Assistance Program for patients that do not have  insurance, or whose insurance does not completely cover the cost of the ZIO monitor.  You must apply for the Patient Assistance Program to qualify for this discounted rate.  To apply, please call [employer] at [phone], select option 4, select option 2, ask to apply for  Patient Assistance Program. Theodore Demark will ask your household income, and how many people  are in your household. They will quote your out-of-pocket cost based on that information.  [employer] will also be able to set up a 20-month, interest-free payment plan if needed.  Applying the monitor   Shave hair from upper left chest.  Hold abrader disc by orange tab. Rub abrader in 40 strokes over the upper left chest as  indicated in your monitor instructions.  Clean area with 4 enclosed alcohol pads. Let dry.  Apply patch as indicated in monitor instructions. Patch will be placed under collarbone on left  side of chest with arrow pointing upward.  Rub patch adhesive wings for 2 minutes. Remove white label marked "1". Remove the white  label marked "2".  Rub patch adhesive wings for 2 additional minutes.  While looking in a mirror, press and release button in center of patch. A small green light will  flash 3-4 times. This will be your only indicator that the monitor has been turned on.  Do not shower for the first 24 hours. You may shower after the first 24 hours.  Press the button if you feel a symptom. You will hear a small click. Record Date, Time and  Symptom in the Patient Logbook.  When you are ready to remove the patch, follow instructions on the last 2 pages of Patient  Logbook. Stick patch monitor onto the last page of Patient Logbook.  Place Patient Logbook in the blue and white box. Use locking tab on box and tape box closed  securely. The blue and white box has prepaid postage on it. Please place it in the mailbox as  soon as possible. Your physician should have your test results approximately 7 days after the  monitor has been mailed back to [facility].  Call [place] at [phone] if you have questions regarding  your ZIO XT patch monitor. Call them immediately if  you see an orange light blinking on your  monitor.  If your monitor falls off in less than 4 days, contact our Monitor department at [phone].  If your monitor becomes loose or falls off after 4 days call [employer] at [phone] for  suggestions on securing your monitor    Follow-Up: At [facility], you and your health needs are our priority.  As part of our continuing mission to provide you with exceptional heart care, we have created designated Provider Care Teams.  These Care Teams include your primary Cardiologist (physician) and Advanced Practice Providers (APPs -  Physician Assistants and Nurse Practitioners) who all work together to provide you with the care you need, when you need it.  We recommend signing up for the patient portal called "MyChart".  Sign up information is provided on this After Visit Summary.  MyChart is  used to connect with patients for Virtual Visits (Telemedicine).  Patients are able to view lab/test results, encounter notes, upcoming appointments, etc.  Non-urgent messages can be sent to your provider as well.   To learn more about what you can do with MyChart, go to [email or web address].    Your next appointment:    As needed.  The format for your next appointment:   In Person  Provider:   Minus Breeding, MD

## 2021-01-28 NOTE — Progress Notes (Unsigned)
Enrolled patient for a 14 day Zio XT  monitor to be mailed to patients home  °

## 2021-01-29 ENCOUNTER — Encounter: Payer: Self-pay | Admitting: Family Medicine

## 2021-01-29 LAB — COMPREHENSIVE METABOLIC PANEL
ALT: 18 IU/L (ref 0–32)
AST: 16 IU/L (ref 0–40)
Albumin/Globulin Ratio: 2 (ref 1.2–2.2)
Albumin: 4.5 g/dL (ref 3.8–4.8)
Alkaline Phosphatase: 166 IU/L — ABNORMAL HIGH (ref 44–121)
BUN/Creatinine Ratio: 11 (ref 9–23)
BUN: 9 mg/dL (ref 6–24)
Bilirubin Total: 0.5 mg/dL (ref 0.0–1.2)
CO2: 22 mmol/L (ref 20–29)
Calcium: 9.3 mg/dL (ref 8.7–10.2)
Chloride: 103 mmol/L (ref 96–106)
Creatinine, Ser: 0.79 mg/dL (ref 0.57–1.00)
Globulin, Total: 2.3 g/dL (ref 1.5–4.5)
Glucose: 100 mg/dL — ABNORMAL HIGH (ref 70–99)
Potassium: 4.6 mmol/L (ref 3.5–5.2)
Sodium: 138 mmol/L (ref 134–144)
Total Protein: 6.8 g/dL (ref 6.0–8.5)
eGFR: 93 mL/min/{1.73_m2} (ref >59)

## 2021-01-29 LAB — CBC
Hematocrit: 37.3 % (ref 34.0–46.6)
Hemoglobin: 12 g/dL (ref 11.1–15.9)
MCH: 26.1 pg — ABNORMAL LOW (ref 26.6–33.0)
MCHC: 32.2 g/dL (ref 31.5–35.7)
MCV: 81 fL (ref 79–97)
Platelets: 314 10*3/uL (ref 150–450)
RBC: 4.6 x10E6/uL (ref 3.77–5.28)
RDW: 15.3 % (ref 11.7–15.4)
WBC: 6.1 10*3/uL (ref 3.4–10.8)

## 2021-01-29 LAB — LIPID PANEL
Chol/HDL Ratio: 2.4 ratio (ref 0.0–4.4)
Cholesterol, Total: 148 mg/dL (ref 100–199)
HDL: 61 mg/dL (ref >39)
LDL Chol Calc (NIH): 69 mg/dL (ref 0–99)
Triglycerides: 100 mg/dL (ref 0–149)
VLDL Cholesterol Cal: 18 mg/dL (ref 5–40)

## 2021-01-29 LAB — TSH: TSH: 1.48 u[IU]/mL (ref 0.450–4.500)

## 2021-01-29 LAB — VITAMIN D 25 HYDROXY (VIT D DEFICIENCY, FRACTURES): Vit D, 25-Hydroxy: 32.8 ng/mL (ref 30.0–100.0)

## 2021-01-29 LAB — HEMOGLOBIN A1C
Est. average glucose Bld gHb Est-mCnc: 120 mg/dL
Hgb A1c MFr Bld: 5.8 % — ABNORMAL HIGH (ref 4.8–5.6)

## 2021-02-01 LAB — HM DIABETES EYE EXAM

## 2021-02-11 ENCOUNTER — Other Ambulatory Visit: Payer: Self-pay

## 2021-02-11 ENCOUNTER — Encounter (HOSPITAL_BASED_OUTPATIENT_CLINIC_OR_DEPARTMENT_OTHER): Payer: Self-pay

## 2021-02-11 ENCOUNTER — Ambulatory Visit (HOSPITAL_BASED_OUTPATIENT_CLINIC_OR_DEPARTMENT_OTHER)
Admission: RE | Admit: 2021-02-11 | Discharge: 2021-02-11 | Disposition: A | Discharge status: Home / Self Care | Payer: BC Managed Care – PPO | Source: Ambulatory Visit | Attending: Cardiology | Admitting: Cardiology

## 2021-02-11 DIAGNOSIS — R42 Dizziness and giddiness: Secondary | ICD-10-CM

## 2021-02-11 DIAGNOSIS — E8881 Metabolic syndrome: Secondary | ICD-10-CM | POA: Insufficient documentation

## 2021-02-11 DIAGNOSIS — E88819 Insulin resistance, unspecified: Secondary | ICD-10-CM

## 2021-02-11 DIAGNOSIS — I10 Essential (primary) hypertension: Secondary | ICD-10-CM

## 2021-02-17 DIAGNOSIS — I10 Essential (primary) hypertension: Secondary | ICD-10-CM | POA: Diagnosis not present

## 2021-02-17 DIAGNOSIS — E8881 Metabolic syndrome: Secondary | ICD-10-CM

## 2021-02-17 DIAGNOSIS — R42 Dizziness and giddiness: Secondary | ICD-10-CM

## 2021-02-18 ENCOUNTER — Encounter: Payer: Self-pay | Admitting: Cardiology

## 2021-02-24 NOTE — Assessment & Plan Note (Signed)
Pt's PE WNL w/ exception of obesity.  UTD on mammo, pap, colonoscopy, Tdap, and flu.  Check labs to risk stratify.  Anticipatory guidance provided.

## 2021-02-24 NOTE — Assessment & Plan Note (Signed)
Ongoing issue for pt.  Currently on Ozempic.  She is tolerating current dose of 0.5mg  weekly so will increase to 1mg  weekly.  Pt expressed understanding and is in agreement w/ plan.

## 2021-03-06 ENCOUNTER — Other Ambulatory Visit: Payer: Self-pay | Admitting: Family Medicine

## 2021-03-10 DIAGNOSIS — R42 Dizziness and giddiness: Secondary | ICD-10-CM | POA: Diagnosis not present

## 2021-03-10 DIAGNOSIS — I10 Essential (primary) hypertension: Secondary | ICD-10-CM | POA: Diagnosis not present

## 2021-03-12 ENCOUNTER — Telehealth: Payer: Self-pay | Admitting: Cardiology

## 2021-03-12 MED ORDER — METOPROLOL SUCCINATE ER 25 MG PO TB24: 25.0000 mg | ORAL_TABLET | Freq: Every day | ORAL | 3 refills | Status: DC

## 2021-03-12 NOTE — Telephone Encounter (Signed)
Pt returned call, transferred to RN.

## 2021-03-12 NOTE — Telephone Encounter (Signed)
Left message for pt to call.

## 2021-03-12 NOTE — Telephone Encounter (Signed)
Patient returned call for her monitor results.  

## 2021-03-27 ENCOUNTER — Other Ambulatory Visit: Payer: Self-pay | Admitting: Family Medicine

## 2021-03-27 DIAGNOSIS — I10 Essential (primary) hypertension: Secondary | ICD-10-CM

## 2021-04-18 DIAGNOSIS — I471 Supraventricular tachycardia: Secondary | ICD-10-CM | POA: Insufficient documentation

## 2021-04-18 DIAGNOSIS — E118 Type 2 diabetes mellitus with unspecified complications: Secondary | ICD-10-CM | POA: Insufficient documentation

## 2021-04-18 NOTE — Progress Notes (Deleted)
?  ?Cardiology Office Note ? ? ?Date:  04/18/2021  ? ?IDNetty Houston, DOB [birth year], MRN [id number] ? ?PCP:  Midge Minium, MD  ?Cardiologist:   Minus Breeding, MD ?Referring:  Midge Minium, MD ? ?No chief complaint on file. ? ? ? ?  ?History of Present Illness: ?Carol Houston is a 51 y.o. female who presents for evaluation of dizziness and palpitations.   In Dec 2022 she had a zero calcium score.  She had this because of a strong family history.  She had a She had a monitor in January with some SVT and I suggest starting a low-dose beta-blocker.  She returns for follow-up. *** ? ? ?***  She has no past cardiac history.  However, her mom had bypass in her early 36s.  The patient has not had no prior cardiac testing.  She has had dizziness for about 2 months.  This happens at rest.  It happens when she is sitting and never when she is up and moving around.  In fact that she gets up to move around it will go away.  It is associated with a presyncope but she has not actually passed out.  She thinks is happening with increased frequency and now almost daily.  She does not feel palpitations with this.  What made her nervous and made her come here was that 2 days ago her heart was flipping.  Was lasting for a few seconds.  She did not have this before.  She did not describe a prolonged tachyarrhythmia but felt it skipped and felt for several weeks. ? ?Of note the patient is not particularly active.  She vacuums or climbs stairs.  She does not really have symptoms with this other than dyspnea that she blames on being out of shape.  She does not describe PND or orthopnea.  She has had no chest pressure, neck or arm discomfort. ? ? ?Past Medical History:  ?Diagnosis Date  ? Allergy   ? seasonal allergies  ? Depression   ? after father died -hx  ? History of gestational diabetes   ? Hypertension   ? on meds  ? PCOS (polycystic ovarian syndrome)   ? Pregnancy induced hypertension   ? ? ?Past Surgical History:   ?Procedure Laterality Date  ? CARPAL TUNNEL RELEASE    ? CESAREAN SECTION N/A 11/28/2014  ? Procedure: CESAREAN SECTION;  Surgeon: Carol Gell, MD;  Location: [facility];  Service: Obstetrics;  Laterality: N/A;  ? uterine polyps removal    ? WISDOM TOOTH EXTRACTION    ? ? ? ?Current Outpatient Medications  ?Medication Sig Dispense Refill  ? albuterol (VENTOLIN HFA) 108 (90 Base) MCG/ACT inhaler Inhale 2 puffs into the lungs every 6 (six) hours as needed for wheezing or shortness of breath. 18 g 1  ? azelastine (ASTELIN) 0.1 % nasal spray Place 2 sprays into both nostrils 2 (two) times daily. Use in each nostril as directed (Patient taking differently: Place 2 sprays into both nostrils 2 (two) times daily as needed. Use in each nostril as directed) 30 mL 12  ? losartan (COZAAR) 50 MG tablet TAKE 1 TABLET BY MOUTH EVERY DAY 90 tablet 0  ? metFORMIN (GLUCOPHAGE) 500 MG tablet TAKE 1 TABLET (500 MG TOTAL) BY MOUTH 2 (TWO) TIMES DAILY. PT TAKES FOR PCOS 180 tablet 1  ? metoprolol succinate (TOPROL XL) 25 MG 24 hr tablet Take 1 tablet (25 mg total) by mouth at bedtime. 90 tablet 3  ?  Nutritional Supplements (VITAMIN D MAINTENANCE PO) Take by mouth.    ? Semaglutide, 1 MG/DOSE, 4 MG/3ML SOPN Inject 1 mg as directed once a week. 3 mL 3  ? ?No current facility-administered medications for this visit.  ? ? ?Allergies:   Codeine  ? ? ?Social History:  The patient  reports that she has never smoked. She has never used smokeless tobacco. She reports that she does not currently use alcohol. She reports that she does not use drugs.  ? ?Family History:  The patient's family history includes Asthma in her father; Breast cancer (age of onset: 95) in her sister; Cerebral palsy in her brother; Colon polyps (age of onset: 34) in her father; Diabetes in her brother and mother; Heart attack (age of onset: 24) in her mother; Heart disease in her mother; Melanoma (age of onset: 42) in her mother.  ? ? ?ROS:  Please see the history of  present illness.   Otherwise, review of systems are positive for ***.   All other systems are reviewed and negative.  ? ? ?PHYSICAL EXAM: ?VS:  There were no vitals taken for this visit. , BMI There is no height or weight on file to calculate BMI. ?GENERAL:  Well appearing ?NECK:  No jugular venous distention, waveform within normal limits, carotid upstroke brisk and symmetric, no bruits, no thyromegaly ?LUNGS:  Clear to auscultation bilaterally ?CHEST:  Unremarkable ?HEART:  PMI not displaced or sustained,S1 and S2 within normal limits, no S3, no S4, no clicks, no rubs, *** murmurs ?ABD:  Flat, positive bowel sounds normal in frequency in pitch, no bruits, no rebound, no guarding, no midline pulsatile mass, no hepatomegaly, no splenomegaly ?EXT:  2 plus pulses throughout, no edema, no cyanosis no clubbing ? ? ? ?***GENERAL:  Well appearing ?HEENT:  Pupils equal round and reactive, fundi not visualized, oral mucosa unremarkable ?NECK:  No jugular venous distention, waveform within normal limits, carotid upstroke brisk and symmetric, no bruits, no thyromegaly ?LYMPHATICS:  No cervical, inguinal adenopathy ?LUNGS:  Clear to auscultation bilaterally ?BACK:  No CVA tenderness ?CHEST:  Unremarkable ?HEART:  PMI not displaced or sustained,S1 and S2 within normal limits, no S3, no S4, no clicks, no rubs, no murmurs ?ABD:  Flat, positive bowel sounds normal in frequency in pitch, no bruits, no rebound, no guarding, no midline pulsatile mass, no hepatomegaly, no splenomegaly ?EXT:  2 plus pulses throughout, no edema, no cyanosis no clubbing ?SKIN:  No rashes no nodules ?NEURO:  Cranial nerves II through XII grossly intact, motor grossly intact throughout ?PSYCH:  Cognitively intact, oriented to person place and time ? ? ? ?EKG:  EKG is *** ordered today. ?The ekg ordered today demonstrates sinus rhythm, rate ***, axis within normal limits, intervals within normal limits, no acute ST-T wave changes. ? ? ?Recent  Labs: ?01/28/2021: ALT 18; BUN 9; Creatinine, Ser 0.79; Hemoglobin 12.0; Platelets 314; Potassium 4.6; Sodium 138; TSH 1.480  ? ? ?Lipid Panel ?   ?Component Value Date/Time  ? CHOL 148 01/28/2021 0851  ? TRIG 100 01/28/2021 0851  ? HDL 61 01/28/2021 0851  ? CHOLHDL 2.4 01/28/2021 0851  ? CHOLHDL 2 01/27/2020 0840  ? VLDL 18.2 01/27/2020 0840  ? [place] 69 01/28/2021 0851  ? LDLDIRECT 81.0 11/08/2017 1023  ? ?  ? ?Wt Readings from Last 3 Encounters:  ?01/28/21 251 lb (113.9 kg)  ?01/27/21 251 lb 12.8 oz (114.2 kg)  ?04/24/20 252 lb (114.3 kg)  ?  ? ? ?Other studies Reviewed: ?  Additional studies/ records that were reviewed today include: *** ?Review of the above records demonstrates:  Please see elsewhere in the note.   ? ? ?ASSESSMENT AND PLAN: ? ?DIZZINESS:    She did have some SVT.***  Patient has dizziness and palpitations.  I am not sure that the 2 are related.  I will check blood work to include a TSH and electrolytes.  I will check a 2-week event monitor. ? ?HTN: Her blood pressure is *** controlled.  Continue the meds as listed. ? ?DM:  ***  I will check an A1c ? ?FAMILY HISTORY OF EARLY CAD:   She had a 0 calcium score.  Given her family history I would like to screen her with a coronary calcium score.  ? ? ?Current medicines are reviewed at length with the patient today.  The patient does not have concerns regarding medicines. ? ?The following changes have been made:  no change ? ?Labs/ tests ordered today include:  ? ?No orders of the defined types were placed in this encounter. ? ? ? ?Disposition:   FU with me as needed.   ? ? ?Signed, ?Minus Breeding, MD  ?04/18/2021 11:56 AM    ?[place] ? ? ? ?

## 2021-04-19 ENCOUNTER — Encounter: Payer: Self-pay | Admitting: Cardiology

## 2021-04-19 ENCOUNTER — Ambulatory Visit: Payer: BC Managed Care – PPO | Admitting: Cardiology

## 2021-04-19 ENCOUNTER — Other Ambulatory Visit: Payer: Self-pay

## 2021-04-19 VITALS — BP 160/90 | Ht 66.0 in | Wt 239.6 lb

## 2021-04-19 DIAGNOSIS — I471 Supraventricular tachycardia: Secondary | ICD-10-CM | POA: Diagnosis not present

## 2021-04-19 DIAGNOSIS — E118 Type 2 diabetes mellitus with unspecified complications: Secondary | ICD-10-CM

## 2021-04-19 DIAGNOSIS — I10 Essential (primary) hypertension: Secondary | ICD-10-CM

## 2021-04-19 DIAGNOSIS — R42 Dizziness and giddiness: Secondary | ICD-10-CM

## 2021-04-19 NOTE — Patient Instructions (Signed)
Medication Instructions:  ?Your physician recommends that you continue on your current medications as directed. Please refer to the Current Medication list given to you today.  ? ?Labwork: ?NONE ? ?Testing/Procedures: ?NONE ? ?Follow-Up: ?AS NEEDED  ? ?  ?

## 2021-04-19 NOTE — Progress Notes (Signed)
?  ?Cardiology Office Note ? ? ?Date:  04/19/2021  ? ?IDJusteen Houston, DOB 17-Jun-1970, MRN [id number] ? ?PCP:  Carol Minium, MD  ?Cardiologist:   Carol Breeding, MD ? ? ?Chief Complaint  ?Patient presents with  ? Palpitations  ? ? ? ?  ?History of Present Illness: ?Carol Houston is a 51 y.o. female who presents for evaluation of dizziness and palpitations.   In Dec 2022 she had a zero calcium score.  She had this because of a strong family history.  She had a She had a monitor in January with some SVT and I suggest starting a low-dose beta-blocker.  She returns for follow-up.  ? ?Since starting the metoprolol and since she started exercising and lost about 15 pounds she is having weight loss SVT.  She also has been handling the stress at work better.  She had only probably 1 episode of rapid palpitations.  Her.  She is not having any chest pressure, neck or arm discomfort.  She is not having any new shortness of breath, PND or orthopnea.  He is having no weight gain or edema. ? ? ?Past Medical History:  ?Diagnosis Date  ? Allergy   ? seasonal allergies  ? Depression   ? after father died -hx  ? History of gestational diabetes   ? Hypertension   ? on meds  ? PCOS (polycystic ovarian syndrome)   ? Pregnancy induced hypertension   ? ? ?Past Surgical History:  ?Procedure Laterality Date  ? CARPAL TUNNEL RELEASE    ? CESAREAN SECTION N/A 11/28/2014  ? Procedure: CESAREAN SECTION;  Surgeon: Carol Gell, MD;  Location: [facility];  Service: Obstetrics;  Laterality: N/A;  ? uterine polyps removal    ? WISDOM TOOTH EXTRACTION    ? ? ? ?Current Outpatient Medications  ?Medication Sig Dispense Refill  ? albuterol (VENTOLIN HFA) 108 (90 Base) MCG/ACT inhaler Inhale 2 puffs into the lungs every 6 (six) hours as needed for wheezing or shortness of breath. 18 g 1  ? azelastine (ASTELIN) 0.1 % nasal spray Place 2 sprays into both nostrils 2 (two) times daily. Use in each nostril as directed (Patient taking differently: Place 2  sprays into both nostrils 2 (two) times daily as needed. Use in each nostril as directed) 30 mL 12  ? losartan (COZAAR) 50 MG tablet TAKE 1 TABLET BY MOUTH EVERY DAY 90 tablet 0  ? metFORMIN (GLUCOPHAGE) 500 MG tablet TAKE 1 TABLET (500 MG TOTAL) BY MOUTH 2 (TWO) TIMES DAILY. PT TAKES FOR PCOS 180 tablet 1  ? metoprolol succinate (TOPROL XL) 25 MG 24 hr tablet Take 1 tablet (25 mg total) by mouth at bedtime. 90 tablet 3  ? Nutritional Supplements (VITAMIN D MAINTENANCE PO) Take by mouth.    ? Semaglutide, 1 MG/DOSE, 4 MG/3ML SOPN Inject 1 mg as directed once a week. 3 mL 3  ? ?No current facility-administered medications for this visit.  ? ? ?Allergies:   Codeine  ? ? ?Social History:  The patient  reports that she has never smoked. She has never used smokeless tobacco. She reports that she does not currently use alcohol. She reports that she does not use drugs.  ? ?Family History:  The patient's family history includes Asthma in her father; Breast cancer (age of onset: 16) in her sister; Cerebral palsy in her brother; Colon polyps (age of onset: 73) in her father; Diabetes in her brother and mother; Heart attack (age of onset: 50)  in her mother; Heart disease in her mother; Melanoma (age of onset: 25) in her mother.  ? ? ?ROS:  Please see the history of present illness.   Otherwise, review of systems are positive for none.   All other systems are reviewed and negative.  ? ? ?PHYSICAL EXAM: ?VS:  BP (!) 160/90   Ht '5\' 6"'$  (1.676 m)   Wt 239 lb 9.6 oz (108.7 kg)   BMI 38.67 kg/m?  , BMI Body mass index is 38.67 kg/m?. ?GENERAL:  Well appearing ?NECK:  No jugular venous distention, waveform within normal limits, carotid upstroke brisk and symmetric, no bruits, no thyromegaly ?LUNGS:  Clear to auscultation bilaterally ?CHEST:  Unremarkable ?HEART:  PMI not displaced or sustained,S1 and S2 within normal limits, no S3, no S4, no clicks, no rubs, no murmurs ?ABD:  Flat, positive bowel sounds normal in frequency in  pitch, no bruits, no rebound, no guarding, no midline pulsatile mass, no hepatomegaly, no splenomegaly ?EXT:  2 plus pulses throughout, no edema, no cyanosis no clubbing ? ? ?EKG:  EKG is  not ordered today. ? ? ?Recent Labs: ?01/28/2021: ALT 18; BUN 9; Creatinine, Ser 0.79; Hemoglobin 12.0; Platelets 314; Potassium 4.6; Sodium 138; TSH 1.480  ? ? ?Lipid Panel ?   ?Component Value Date/Time  ? CHOL 148 01/28/2021 0851  ? TRIG 100 01/28/2021 0851  ? HDL 61 01/28/2021 0851  ? CHOLHDL 2.4 01/28/2021 0851  ? CHOLHDL 2 01/27/2020 0840  ? VLDL 18.2 01/27/2020 0840  ? [place] 69 01/28/2021 0851  ? LDLDIRECT 81.0 11/08/2017 1023  ? ?  ? ?Wt Readings from Last 3 Encounters:  ?04/19/21 239 lb 9.6 oz (108.7 kg)  ?01/28/21 251 lb (113.9 kg)  ?01/27/21 251 lb 12.8 oz (114.2 kg)  ?  ? ? ?Other studies Reviewed: ?Additional studies/ records that were reviewed today include: None ?Review of the above records demonstrates:  NA ? ? ?ASSESSMENT AND PLAN: ? ?DIZZINESS:    She did have some SVT.  This is much improved.  She wants to continue the beta-blocker.  Otherwise no change in therapy.  ? ?HTN: Her blood pressure is elevated but she says this is quite unusual and it is usually not this elevated when checked in the doctor's office.  She will keep a blood pressure diary.  ? ?DM: A1c was 5.8. Per Carol Minium, MD ? ?FAMILY HISTORY OF EARLY CAD:   She had a 0 calcium score.  No further work-up.  She needs continued primary risk reduction. ? ? ?Current medicines are reviewed at length with the patient today.  The patient does not have concerns regarding medicines. ? ?The following changes have been made:  no change ? ?Labs/ tests ordered today include:  ? ?No orders of the defined types were placed in this encounter. ? ? ? ?Disposition:   FU with me as needed.   ? ? ?Signed, ?Carol Breeding, MD  ?04/19/2021 4:19 PM    ?[place] ? ? ? ?

## 2021-05-17 DIAGNOSIS — Z30432 Encounter for removal of intrauterine contraceptive device: Secondary | ICD-10-CM | POA: Diagnosis not present

## 2021-05-17 DIAGNOSIS — N739 Female pelvic inflammatory disease, unspecified: Secondary | ICD-10-CM | POA: Insufficient documentation

## 2021-05-17 DIAGNOSIS — N762 Acute vulvitis: Secondary | ICD-10-CM | POA: Insufficient documentation

## 2021-05-17 DIAGNOSIS — E282 Polycystic ovarian syndrome: Secondary | ICD-10-CM | POA: Diagnosis not present

## 2021-05-17 DIAGNOSIS — Z1231 Encounter for screening mammogram for malignant neoplasm of breast: Secondary | ICD-10-CM | POA: Diagnosis not present

## 2021-05-17 DIAGNOSIS — Z6839 Body mass index (BMI) 39.0-39.9, adult: Secondary | ICD-10-CM | POA: Diagnosis not present

## 2021-05-17 DIAGNOSIS — Z309 Encounter for contraceptive management, unspecified: Secondary | ICD-10-CM | POA: Diagnosis not present

## 2021-05-17 DIAGNOSIS — Z01419 Encounter for gynecological examination (general) (routine) without abnormal findings: Secondary | ICD-10-CM | POA: Diagnosis not present

## 2021-05-17 DIAGNOSIS — N921 Excessive and frequent menstruation with irregular cycle: Secondary | ICD-10-CM | POA: Diagnosis not present

## 2021-06-15 DIAGNOSIS — I10 Essential (primary) hypertension: Secondary | ICD-10-CM | POA: Diagnosis not present

## 2021-06-30 ENCOUNTER — Other Ambulatory Visit: Payer: Self-pay | Admitting: Family Medicine

## 2021-06-30 DIAGNOSIS — I10 Essential (primary) hypertension: Secondary | ICD-10-CM

## 2021-07-05 ENCOUNTER — Other Ambulatory Visit: Payer: Self-pay | Admitting: Family Medicine

## 2021-07-15 DIAGNOSIS — R7309 Other abnormal glucose: Secondary | ICD-10-CM | POA: Diagnosis not present

## 2021-07-15 DIAGNOSIS — Z7189 Other specified counseling: Secondary | ICD-10-CM | POA: Diagnosis not present

## 2021-07-27 ENCOUNTER — Ambulatory Visit: Payer: BC Managed Care – PPO | Admitting: Family Medicine

## 2021-08-05 ENCOUNTER — Encounter: Payer: Self-pay | Admitting: Family Medicine

## 2021-08-05 ENCOUNTER — Ambulatory Visit: Payer: BC Managed Care – PPO | Admitting: Family Medicine

## 2021-08-05 VITALS — BP 126/76 | HR 78 | Temp 97.9°F | Resp 16 | Ht 66.0 in | Wt 245.1 lb

## 2021-08-05 DIAGNOSIS — Z136 Encounter for screening for cardiovascular disorders: Secondary | ICD-10-CM

## 2021-08-05 DIAGNOSIS — I10 Essential (primary) hypertension: Secondary | ICD-10-CM | POA: Diagnosis not present

## 2021-08-05 DIAGNOSIS — E118 Type 2 diabetes mellitus with unspecified complications: Secondary | ICD-10-CM | POA: Diagnosis not present

## 2021-08-05 LAB — CBC WITH DIFFERENTIAL/PLATELET
Basophils Absolute: 0 K/uL (ref 0.0–0.1)
Basophils Relative: 0.5 % (ref 0.0–3.0)
Eosinophils Absolute: 0.2 K/uL (ref 0.0–0.7)
Eosinophils Relative: 4 % (ref 0.0–5.0)
HCT: 37.6 % (ref 36.0–46.0)
Hemoglobin: 12 g/dL (ref 12.0–15.0)
Lymphocytes Relative: 31.8 % (ref 12.0–46.0)
Lymphs Abs: 1.8 K/uL (ref 0.7–4.0)
MCHC: 31.8 g/dL (ref 30.0–36.0)
MCV: 80.9 fl (ref 78.0–100.0)
Monocytes Absolute: 0.5 K/uL (ref 0.1–1.0)
Monocytes Relative: 8.3 % (ref 3.0–12.0)
Neutro Abs: 3.1 K/uL (ref 1.4–7.7)
Neutrophils Relative %: 55.4 % (ref 43.0–77.0)
Platelets: 328 K/uL (ref 150.0–400.0)
RBC: 4.65 Mil/uL (ref 3.87–5.11)
RDW: 15 % (ref 11.5–15.5)
WBC: 5.6 K/uL (ref 4.0–10.5)

## 2021-08-05 LAB — LIPID PANEL
Cholesterol: 145 mg/dL (ref 0–200)
HDL: 56.4 mg/dL (ref >39.00)
LDL Cholesterol: 71 mg/dL (ref 0–99)
NonHDL: 88.44
Total CHOL/HDL Ratio: 3
Triglycerides: 88 mg/dL (ref 0.0–149.0)
VLDL: 17.6 mg/dL (ref 0.0–40.0)

## 2021-08-05 LAB — TSH: TSH: 1.09 u[IU]/mL (ref 0.35–5.50)

## 2021-08-05 LAB — HEMOGLOBIN A1C: Hgb A1c MFr Bld: 5.8 % (ref 4.6–6.5)

## 2021-08-05 LAB — BASIC METABOLIC PANEL
BUN: 13 mg/dL (ref 6–23)
CO2: 27 mEq/L (ref 19–32)
Calcium: 9.5 mg/dL (ref 8.4–10.5)
Chloride: 104 mEq/L (ref 96–112)
Creatinine, Ser: 0.82 mg/dL (ref 0.40–1.20)
GFR: 84.54 mL/min (ref >60.00)
Glucose, Bld: 88 mg/dL (ref 70–99)
Potassium: 4.6 mEq/L (ref 3.5–5.1)
Sodium: 137 mEq/L (ref 135–145)

## 2021-08-05 LAB — HEPATIC FUNCTION PANEL
ALT: 13 U/L (ref 0–35)
AST: 14 U/L (ref 0–37)
Albumin: 4.1 g/dL (ref 3.5–5.2)
Alkaline Phosphatase: 151 U/L — ABNORMAL HIGH (ref 39–117)
Bilirubin, Direct: 0.1 mg/dL (ref 0.0–0.3)
Total Bilirubin: 0.6 mg/dL (ref 0.2–1.2)
Total Protein: 7.2 g/dL (ref 6.0–8.3)

## 2021-08-05 MED ORDER — SEMAGLUTIDE (2 MG/DOSE) 8 MG/3ML ~~LOC~~ SOPN: 2.0000 mg | PEN_INJECTOR | SUBCUTANEOUS | 3 refills | Status: DC

## 2021-08-05 NOTE — Progress Notes (Incomplete)
   Subjective:    Patient ID: Carol Houston, female    DOB: 03/08/1970, 51 y.o.   MRN: [id number]  HPI HTN- chronic problem, on Losartan '50mg'$  daily, Metoprolol XL '25mg'$  daily w/ good control.  Denies CP, SOB, HAs, visual changes, edema.  Obesity- ongoing issue for pt.  She has gained 5 lbs since last visit.  Currently on Ozempic.    DM- chronic problem, on Metformin '500mg'$  BID and Ozempic '1mg'$  weekly.  Pt would like to increase dose.  Last A1C 5.8%  Due for microalbumin, foot exam.  On ARB for renal protection.  UTD on eye exam.  Denies symptomatic lows.  Some nausea w/ medication.  No numbness/tingling of hands/feet.  Review of Systems For ROS see HPI     Objective:   Physical Exam Vitals reviewed.  Constitutional:      General: She is not in acute distress.    Appearance: Normal appearance. She is well-developed. She is obese. She is not ill-appearing.  HENT:     Head: Normocephalic and atraumatic.  Eyes:     Conjunctiva/sclera: Conjunctivae normal.     Pupils: Pupils are equal, round, and reactive to light.  Neck:     Thyroid: No thyromegaly.  Cardiovascular:     Rate and Rhythm: Normal rate and regular rhythm.     Pulses: Normal pulses.     Heart sounds: Normal heart sounds. No murmur heard. Pulmonary:     Effort: Pulmonary effort is normal. No respiratory distress.     Breath sounds: Normal breath sounds.  Abdominal:     General: There is no distension.     Palpations: Abdomen is soft.     Tenderness: There is no abdominal tenderness.  Musculoskeletal:     Cervical back: Normal range of motion and neck supple.     Right lower leg: No edema.     Left lower leg: No edema.  Lymphadenopathy:     Cervical: No cervical adenopathy.  Skin:    General: Skin is warm and dry.  Neurological:     Mental Status: She is alert and oriented to person, place, and time.  Psychiatric:        Behavior: Behavior normal.           Assessment & Plan:

## 2021-08-05 NOTE — Patient Instructions (Signed)
Schedule your complete physical in 6 months We'll notify you of your lab results and make any changes if needed INCREASE the Ozempic to '2mg'$  weekly Keep up the good work on healthy diet and regular exercise- you look great!! Call with any questions or concerns Stay Safe!  Stay Healthy! Have a great summer!!

## 2021-08-14 DIAGNOSIS — Z7189 Other specified counseling: Secondary | ICD-10-CM | POA: Diagnosis not present

## 2021-08-14 DIAGNOSIS — R7309 Other abnormal glucose: Secondary | ICD-10-CM | POA: Diagnosis not present

## 2021-09-03 DIAGNOSIS — J039 Acute tonsillitis, unspecified: Secondary | ICD-10-CM | POA: Diagnosis not present

## 2021-09-14 DIAGNOSIS — R7309 Other abnormal glucose: Secondary | ICD-10-CM | POA: Diagnosis not present

## 2021-09-25 ENCOUNTER — Other Ambulatory Visit: Payer: Self-pay | Admitting: Family Medicine

## 2021-09-25 DIAGNOSIS — I10 Essential (primary) hypertension: Secondary | ICD-10-CM

## 2021-10-15 DIAGNOSIS — R7309 Other abnormal glucose: Secondary | ICD-10-CM | POA: Diagnosis not present

## 2021-11-14 DIAGNOSIS — R7309 Other abnormal glucose: Secondary | ICD-10-CM | POA: Diagnosis not present

## 2021-11-27 ENCOUNTER — Other Ambulatory Visit: Payer: Self-pay | Admitting: Family Medicine

## 2021-12-15 DIAGNOSIS — R7309 Other abnormal glucose: Secondary | ICD-10-CM | POA: Diagnosis not present

## 2021-12-20 DIAGNOSIS — R519 Headache, unspecified: Secondary | ICD-10-CM | POA: Diagnosis not present

## 2021-12-20 DIAGNOSIS — R6889 Other general symptoms and signs: Secondary | ICD-10-CM | POA: Diagnosis not present

## 2021-12-21 ENCOUNTER — Encounter: Payer: Self-pay | Admitting: Family Medicine

## 2021-12-21 NOTE — Telephone Encounter (Signed)
Patient reports going to urgent care yesterday, still no relief from Migraine should she have a virtual to discuss?

## 2021-12-21 NOTE — Telephone Encounter (Signed)
Patient has been scheduled

## 2021-12-22 ENCOUNTER — Ambulatory Visit: Payer: BC Managed Care – PPO | Admitting: Family Medicine

## 2021-12-22 ENCOUNTER — Encounter: Payer: Self-pay | Admitting: Family Medicine

## 2021-12-22 VITALS — BP 130/88 | HR 84 | Temp 98.1°F | Resp 16 | Ht 66.0 in | Wt 247.5 lb

## 2021-12-22 DIAGNOSIS — G43911 Migraine, unspecified, intractable, with status migrainosus: Secondary | ICD-10-CM | POA: Diagnosis not present

## 2021-12-22 MED ORDER — SUMATRIPTAN SUCCINATE 50 MG PO TABS: 50.0000 mg | ORAL_TABLET | ORAL | 3 refills | Status: DC | PRN

## 2021-12-22 NOTE — Patient Instructions (Signed)
Follow up as needed or as scheduled TAKE 2 Excedrin Migraine when you leave here If after 20-30 minutes no relief, TAKE the Imitrex (Sumatripan) and try and lie down Drink LOTS of water If no improvement w/ new rescue medication- let me know!!! Call with any questions or concerns Hang in there!!!

## 2021-12-22 NOTE — Progress Notes (Signed)
   Subjective:    Patient ID: Carol Houston, female    DOB: [birth year], 51 y.o.   MRN: [id number]  HPI Migraine- went to UC on 11/6 and was tx'd w/ 'migraine cocktail'.  Sxs started Saturday night and she spent 'all day Sunday in the bed'.  Tm 100.6 Sunday.  Left work Monday to go to UC.  Flu and COVID negative.  After injxn, 'had the worst anxiety attack'.  Given Compazine '10mg'$ , Benadryl '25mg'$ , Decadron '10mg'$ , Toradol '30mg'$ .  HA is behind her eyes but then extends 'all over'.  + photo and phonophobia.  Denies sinus pain/pressure.  No nausea since Monday.  HA improves w/ lying down in dark room.  Remote hx of migraines.  Was dehydrated at time of UC presentation- given IV fluids.  Has not been able to fill Ozempic x2 months but no other med changes.     Review of Systems For ROS see HPI     Objective:   Physical Exam Vitals reviewed.  Constitutional:      General: She is not in acute distress.    Appearance: Normal appearance. She is well-developed. She is obese. She is not ill-appearing.  HENT:     Head: Normocephalic and atraumatic.     Right Ear: Tympanic membrane and ear canal normal.     Left Ear: Tympanic membrane and ear canal normal.  Eyes:     Extraocular Movements: Extraocular movements intact.     Conjunctiva/sclera: Conjunctivae normal.     Pupils: Pupils are equal, round, and reactive to light.  Cardiovascular:     Rate and Rhythm: Normal rate and regular rhythm.     Heart sounds: Normal heart sounds.  Pulmonary:     Effort: Pulmonary effort is normal. No respiratory distress.     Breath sounds: Normal breath sounds. No wheezing or rales.  Musculoskeletal:        General: No tenderness.     Cervical back: Normal range of motion and neck supple.  Lymphadenopathy:     Cervical: No cervical adenopathy.  Skin:    General: Skin is warm and dry.  Neurological:     General: No focal deficit present.     Mental Status: She is alert and oriented to person, place, and time.      Cranial Nerves: No cranial nerve deficit.     Coordination: Coordination normal.     Deep Tendon Reflexes: Reflexes are normal and symmetric.  Psychiatric:        Behavior: Behavior normal.        Thought Content: Thought content normal.        Judgment: Judgment normal.           Assessment & Plan:   Intractable migraine- new.  Sxs started Saturday night and pt was in bed all day Sunday w/ Tm 100.6  Was able to work Monday but left to go to UC.  Was given a migraine cocktail which triggered a panic attack.  She said her sxs finally eased off enough to allow sleep but sxs returned when she woke the next morning.  She has hx of migraines and was previously on Imitrex.  Will again provide rescue medication,  if this doesn't break the cycle, will likely need Prednisone taper.  She will let me know in the next day or so.  Pt expressed understanding and is in agreement w/ plan.

## 2022-01-04 ENCOUNTER — Other Ambulatory Visit: Payer: Self-pay | Admitting: Family Medicine

## 2022-01-04 DIAGNOSIS — I10 Essential (primary) hypertension: Secondary | ICD-10-CM

## 2022-01-14 DIAGNOSIS — R7309 Other abnormal glucose: Secondary | ICD-10-CM | POA: Diagnosis not present

## 2022-02-14 DIAGNOSIS — R7309 Other abnormal glucose: Secondary | ICD-10-CM | POA: Diagnosis not present

## 2022-02-16 ENCOUNTER — Ambulatory Visit (INDEPENDENT_AMBULATORY_CARE_PROVIDER_SITE_OTHER): Payer: BC Managed Care – PPO | Admitting: Family Medicine

## 2022-02-16 ENCOUNTER — Encounter: Payer: Self-pay | Admitting: Family Medicine

## 2022-02-16 VITALS — BP 128/82 | HR 80 | Temp 98.4°F | Resp 17 | Ht 66.0 in | Wt 256.5 lb

## 2022-02-16 DIAGNOSIS — Z0000 Encounter for general adult medical examination without abnormal findings: Secondary | ICD-10-CM | POA: Diagnosis not present

## 2022-02-16 DIAGNOSIS — E118 Type 2 diabetes mellitus with unspecified complications: Secondary | ICD-10-CM | POA: Diagnosis not present

## 2022-02-16 DIAGNOSIS — G5601 Carpal tunnel syndrome, right upper limb: Secondary | ICD-10-CM | POA: Diagnosis not present

## 2022-02-16 DIAGNOSIS — R0683 Snoring: Secondary | ICD-10-CM

## 2022-02-16 LAB — HEPATIC FUNCTION PANEL
ALT: 13 U/L (ref 0–35)
AST: 14 U/L (ref 0–37)
Albumin: 4 g/dL (ref 3.5–5.2)
Alkaline Phosphatase: 132 U/L — ABNORMAL HIGH (ref 39–117)
Bilirubin, Direct: 0.1 mg/dL (ref 0.0–0.3)
Total Bilirubin: 0.6 mg/dL (ref 0.2–1.2)
Total Protein: 7 g/dL (ref 6.0–8.3)

## 2022-02-16 LAB — CBC WITH DIFFERENTIAL/PLATELET
Basophils Absolute: 0 10*3/uL (ref 0.0–0.1)
Basophils Relative: 0.5 % (ref 0.0–3.0)
Eosinophils Absolute: 0.2 10*3/uL (ref 0.0–0.7)
Eosinophils Relative: 3.6 % (ref 0.0–5.0)
HCT: 34.6 % — ABNORMAL LOW (ref 36.0–46.0)
Hemoglobin: 10.8 g/dL — ABNORMAL LOW (ref 12.0–15.0)
Lymphocytes Relative: 27.2 % (ref 12.0–46.0)
Lymphs Abs: 1.5 10*3/uL (ref 0.7–4.0)
MCHC: 31.3 g/dL (ref 30.0–36.0)
MCV: 76 fl — ABNORMAL LOW (ref 78.0–100.0)
Monocytes Absolute: 0.6 10*3/uL (ref 0.1–1.0)
Monocytes Relative: 10.7 % (ref 3.0–12.0)
Neutro Abs: 3.3 10*3/uL (ref 1.4–7.7)
Neutrophils Relative %: 58 % (ref 43.0–77.0)
Platelets: 365 10*3/uL (ref 150.0–400.0)
RBC: 4.56 Mil/uL (ref 3.87–5.11)
RDW: 16.4 % — ABNORMAL HIGH (ref 11.5–15.5)
WBC: 5.7 10*3/uL (ref 4.0–10.5)

## 2022-02-16 LAB — BASIC METABOLIC PANEL
BUN: 11 mg/dL (ref 6–23)
CO2: 26 mEq/L (ref 19–32)
Calcium: 9.1 mg/dL (ref 8.4–10.5)
Chloride: 103 mEq/L (ref 96–112)
Creatinine, Ser: 0.74 mg/dL (ref 0.40–1.20)
GFR: 95.26 mL/min (ref >60.00)
Glucose, Bld: 111 mg/dL — ABNORMAL HIGH (ref 70–99)
Potassium: 4.2 mEq/L (ref 3.5–5.1)
Sodium: 138 mEq/L (ref 135–145)

## 2022-02-16 LAB — LIPID PANEL
Cholesterol: 158 mg/dL (ref 0–200)
HDL: 68.6 mg/dL (ref >39.00)
LDL Cholesterol: 62 mg/dL (ref 0–99)
NonHDL: 88.95
Total CHOL/HDL Ratio: 2
Triglycerides: 134 mg/dL (ref 0.0–149.0)
VLDL: 26.8 mg/dL (ref 0.0–40.0)

## 2022-02-16 LAB — HEMOGLOBIN A1C: Hgb A1c MFr Bld: 6.5 % (ref 4.6–6.5)

## 2022-02-16 LAB — MICROALBUMIN / CREATININE URINE RATIO
Creatinine,U: 108.7 mg/dL
Microalb Creat Ratio: 0.9 mg/g (ref 0.0–30.0)
Microalb, Ur: 0.9 mg/dL (ref 0.0–1.9)

## 2022-02-16 LAB — TSH: TSH: 1.41 u[IU]/mL (ref 0.35–5.50)

## 2022-02-16 LAB — VITAMIN D 25 HYDROXY (VIT D DEFICIENCY, FRACTURES): VITD: 24.08 ng/mL — ABNORMAL LOW (ref 30.00–100.00)

## 2022-02-16 MED ORDER — SEMAGLUTIDE (1 MG/DOSE) 4 MG/3ML ~~LOC~~ SOPN: 1.0000 mg | PEN_INJECTOR | SUBCUTANEOUS | 3 refills | Status: DC

## 2022-02-16 NOTE — Assessment & Plan Note (Signed)
Ongoing issue.  BMI 41.4  Encouraged healthy diet and regular exercise.  Will continue to follow.

## 2022-02-16 NOTE — Assessment & Plan Note (Signed)
Pt's PE WNL w/ exception of BMI.  UTD on pap, mammo, colonoscopy, immunizations.  Check labs.  Anticipatory guidance provided.

## 2022-02-16 NOTE — Patient Instructions (Signed)
Follow up in 6 months to recheck sugar, blood pressure, and weight loss We'll notify you of your lab results and make any changes if needed Continue to work on healthy diet and regular exercise- you look great! We'll call you to schedule your carpal tunnel and snoring appts Have your eye doctor send me a copy of their report Call with any questions or concerns Stay Safe!  Stay Healthy! Happy New Year!!!

## 2022-02-16 NOTE — Progress Notes (Signed)
   Subjective:    Patient ID: Carol Houston, female    DOB: 1970/08/01, 52 y.o.   MRN: [id number]  HPI CPE- UTD on foot exam, pap, Tdap, colonoscopy, mammo [facility]).  Eye exam scheduled.  Patient Care Team    Relationship Specialty Notifications Start End  Midge Minium, MD PCP - General   01/27/10   Minus Breeding, MD PCP - Cardiology Cardiology  01/28/21   Aloha Gell, MD Consulting Physician Obstetrics and Gynecology  04/27/16     Health Maintenance  Topic Date Due   Diabetic kidney evaluation - Urine ACR  Never done   OPHTHALMOLOGY EXAM  02/01/2022   HEMOGLOBIN A1C  02/04/2022   Diabetic kidney evaluation - eGFR measurement  08/06/2022   FOOT EXAM  08/06/2022   PAP SMEAR-Modifier  05/01/2023   DTaP/Tdap/Td (2 - Td or Tdap) 09/17/2024   COLONOSCOPY (Pts 45-34yr Insurance coverage will need to be confirmed)  04/25/2027   INFLUENZA VACCINE  Completed   HIV Screening  Completed   HPV VACCINES  Aged Out   COVID-19 Vaccine  Discontinued   Hepatitis C Screening  Discontinued      Review of Systems Patient reports no vision/ hearing changes, adenopathy,fever, persistant/recurrent hoarseness , swallowing issues, chest pain, palpitations, edema, persistant/recurrent cough, hemoptysis, dyspnea (rest/exertional/paroxysmal nocturnal), gastrointestinal bleeding (melena, rectal bleeding), abdominal pain, significant heartburn, bowel changes, GU symptoms (dysuria, hematuria, incontinence), Gyn symptoms (abnormal  bleeding, pain),  syncope, focal weakness, memory loss, skin/hair/nail changes, abnormal bruising or bleeding, anxiety, or depression.   + weight gain- 8 lbs since November + carpal tunnel R hand x2 months + snoring w/ breathing pauses    Objective:   Physical Exam General Appearance:    Alert, cooperative, no distress, appears stated age, obese  Head:    Normocephalic, without obvious abnormality, atraumatic  Eyes:    PERRL, conjunctiva/corneas clear, EOM's  intact both eyes  Ears:    Normal TM's and external ear canals, both ears  Nose:   Nares normal, septum midline, mucosa normal, no drainage    or sinus tenderness  Throat:   Lips, mucosa, and tongue normal; teeth and gums normal  Neck:   Supple, symmetrical, trachea midline, no adenopathy;    Thyroid: no enlargement/tenderness/nodules  Back:     Symmetric, no curvature, ROM normal, no CVA tenderness  Lungs:     Clear to auscultation bilaterally, respirations unlabored  Chest Wall:    No tenderness or deformity   Heart:    Regular rate and rhythm, S1 and S2 normal, no murmur, rub   or gallop  Breast Exam:    Deferred to GYN  Abdomen:     Soft, non-tender, bowel sounds active all four quadrants,    no masses, no organomegaly  Genitalia:    Deferred to GYN  Rectal:    Extremities:   Extremities normal, atraumatic, no cyanosis or edema  Pulses:   2+ and symmetric all extremities  Skin:   Skin color, texture, turgor normal, no rashes or lesions  Lymph nodes:   Cervical, supraclavicular, and axillary nodes normal  Neurologic:   CNII-XII intact, normal strength, sensation and reflexes    throughout          Assessment & Plan:

## 2022-02-16 NOTE — Assessment & Plan Note (Signed)
Chronic problem.  Pt has not been able to get her Ozempic for the last few months.  Has eye exam scheduled.  UTD on foot exam.  Will get microalbumin.  Check labs.  Adjust meds prn

## 2022-02-17 ENCOUNTER — Other Ambulatory Visit: Payer: Self-pay

## 2022-02-17 ENCOUNTER — Telehealth: Payer: Self-pay

## 2022-02-17 DIAGNOSIS — R7989 Other specified abnormal findings of blood chemistry: Secondary | ICD-10-CM

## 2022-02-17 MED ORDER — VITAMIN D (ERGOCALCIFEROL) 1.25 MG (50000 UNIT) PO CAPS: 50000.0000 [IU] | ORAL_CAPSULE | ORAL | 12 refills | Status: DC

## 2022-02-17 NOTE — Telephone Encounter (Signed)
[facility] sent a letter stating pt has an apt w/ Dr Caralyn Guile on 03/03/22 @ 9:30 am.

## 2022-02-17 NOTE — Telephone Encounter (Signed)
-----   Message from Midge Minium, MD sent at 02/17/2022  7:18 AM EST ----- Labs are stable and look good w/ exception of low Vit D.  Based on this, we need to start 50,000 units weekly x12 weeks in addition to daily OTC supplement of at least 2000 units.

## 2022-02-17 NOTE — Telephone Encounter (Signed)
Left lab results on pt VM . Vit d 50,000 has been sent to pharmacy

## 2022-02-24 LAB — HM DIABETES EYE EXAM

## 2022-03-01 ENCOUNTER — Other Ambulatory Visit: Payer: Self-pay | Admitting: Cardiology

## 2022-03-01 ENCOUNTER — Other Ambulatory Visit: Payer: Self-pay | Admitting: Family Medicine

## 2022-03-01 ENCOUNTER — Encounter: Payer: Self-pay | Admitting: Family Medicine

## 2022-03-03 DIAGNOSIS — G5601 Carpal tunnel syndrome, right upper limb: Secondary | ICD-10-CM | POA: Diagnosis not present

## 2022-03-17 DIAGNOSIS — R7309 Other abnormal glucose: Secondary | ICD-10-CM | POA: Diagnosis not present

## 2022-03-29 ENCOUNTER — Encounter: Payer: Self-pay | Admitting: Neurology

## 2022-03-29 ENCOUNTER — Ambulatory Visit (INDEPENDENT_AMBULATORY_CARE_PROVIDER_SITE_OTHER): Payer: BC Managed Care – PPO | Admitting: Neurology

## 2022-03-29 VITALS — BP 122/82 | HR 73 | Ht 67.0 in | Wt 258.8 lb

## 2022-03-29 DIAGNOSIS — R0683 Snoring: Secondary | ICD-10-CM | POA: Diagnosis not present

## 2022-03-29 DIAGNOSIS — G4719 Other hypersomnia: Secondary | ICD-10-CM | POA: Diagnosis not present

## 2022-03-29 DIAGNOSIS — G4730 Sleep apnea, unspecified: Secondary | ICD-10-CM | POA: Diagnosis not present

## 2022-03-29 DIAGNOSIS — R0681 Apnea, not elsewhere classified: Secondary | ICD-10-CM

## 2022-03-29 NOTE — Patient Instructions (Signed)

## 2022-03-29 NOTE — Progress Notes (Signed)
Subjective:    Patient ID: Carol Houston is a 52 y.o. female.  HPI    Star Age, MD, PhD [facility] [address] [place], [place] [address]  Dear Dr. Birdie Riddle,  I saw your patient, Carol Houston, upon your kind request in my sleep clinic today for initial consultation of her sleep disorder, in particular, concern for underlying obstructive sleep apnea.  The patient is unaccompanied today.  As you know, Ms. Carol Houston is a 52 year old female with an underlying medical history of diabetes, carpal tunnel syndrome, allergies, hypertension, PCOS, history of tachycardia, depression and severe obesity with a BMI of over 40, who reports snoring and excessive daytime somnolence, as well as witnessed apneas per husband's report.  Her Epworth sleepiness score is 10 out of 24, fatigue severity score is 19 out of 63. I reviewed your office note from 02/16/2022.  Her symptoms have become worse over time, she has had weight gain.  She is currently working on weight loss.  Bedtime is generally around 10 PM and rise time around 5:30 AM.  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  She works daytime hours Monday through Friday.  She lives with her husband and 53-year-old son.  They have no pets in the household and she does not have a TV in her bedroom.  She drinks caffeine in limitation, 2 cups of coffee in the morning and occasional tea.  She drinks alcohol rarely.  She is a non-smoker.  She denies night to night nocturia or recurrent nocturnal or morning headaches, she is not aware of any family history of sleep apnea.  Her Past Medical History Is Significant For: Past Medical History:  Diagnosis Date   Allergy    seasonal allergies   Depression    after father died -hx   History of gestational diabetes    Hypertension    on meds   PCOS (polycystic ovarian syndrome)    Pregnancy induced hypertension    Tachycardia     Her Past Surgical  History Is Significant For: Past Surgical History:  Procedure Laterality Date   CARPAL TUNNEL RELEASE     CESAREAN SECTION N/A 11/28/2014   Procedure: CESAREAN SECTION;  Surgeon: Aloha Gell, MD;  Location: [facility];  Service: Obstetrics;  Laterality: N/A;   uterine polyps removal     WISDOM TOOTH EXTRACTION      Her Family History Is Significant For: Family History  Problem Relation Age of Onset   Diabetes Mother    Heart disease Mother    Heart attack Mother 15   Melanoma Mother 51   Asthma Father    Colon polyps Father 61   Breast cancer Sister 66   Cerebral palsy Brother    Diabetes Brother    Diabetes Brother    Diabetes Brother    Colon cancer Neg Hx    Esophageal cancer Neg Hx    Rectal cancer Neg Hx    Stomach cancer Neg Hx     Her Social History Is Significant For: Social History   Socioeconomic History   Marital status: Married    Spouse name: Not on file   Number of children: Not on file   Years of education: Not on file   Highest education level: Not on file  Occupational History   Not on file  Tobacco Use   Smoking status: Never   Smokeless tobacco: Never  Vaping Use   Vaping Use: Never  used  Substance and Sexual Activity   Alcohol use: Yes    Comment: social (no t )often 1-2 year   Drug use: No   Sexual activity: Yes  Other Topics Concern   Not on file  Social History Narrative   Married, one son age seven   Caffiene 634m daily (variable)   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           Social Determinants of Health   Financial Resource Strain: Not on file  Food Insecurity: Not on file  Transportation Needs: Not on file  Physical Activity: Not on file  Stress: Not on file  Social Connections: Not on file    Her Allergies Are:  Allergies  Allergen Reactions   Codeine Hives and Itching  :   Her Current Medications Are:  Outpatient Encounter Medications as of 03/29/2022  Medication Sig   albuterol (VENTOLIN  HFA) 108 (90 Base) MCG/ACT inhaler Inhale 2 puffs into the lungs every 6 (six) hours as needed for wheezing or shortness of breath.   azelastine (ASTELIN) 0.1 % nasal spray Place 2 sprays into both nostrils 2 (two) times daily. Use in each nostril as directed (Patient taking differently: Place 2 sprays into both nostrils 2 (two) times daily as needed. Use in each nostril as directed)   losartan (COZAAR) 50 MG tablet TAKE 1 TABLET BY MOUTH EVERY DAY   metFORMIN (GLUCOPHAGE) 500 MG tablet TAKE 1 TABLET (500 MG TOTAL) BY MOUTH 2 (TWO) TIMES DAILY. PT TAKES FOR PCOS   metoprolol succinate (TOPROL-XL) 25 MG 24 hr tablet Take 1 tablet (25 mg total) by mouth daily.   montelukast (SINGULAIR) 10 MG tablet Take 10 mg by mouth daily.   SUMAtriptan (IMITREX) 50 MG tablet Take 1 tablet (50 mg total) by mouth every 2 (two) hours as needed for migraine. May repeat in 2 hours if headache persists or recurs.   Vitamin D, Ergocalciferol, (DRISDOL) 1.25 MG (50000 UNIT) CAPS capsule Take 1 capsule (50,000 Units total) by mouth every 7 (seven) days.   [DISCONTINUED] Semaglutide, 1 MG/DOSE, 4 MG/3ML SOPN Inject 1 mg as directed once a week.   No facility-administered encounter medications on file as of 03/29/2022.  :   Review of Systems:  Out of a complete 14 point review of systems, all are reviewed and negative with the exception of these symptoms as listed below:   Review of Systems  Neurological:        Snoring,  noted apnea (witnessed by husband).   Feels tired all the time, does not feel rested. ESS 10, FSS 19.    Objective:  Neurological Exam  Physical Exam Physical Examination:   Vitals:   03/29/22 1051 03/29/22 1110  BP: (!) 148/95 122/82  Pulse: 85 73  SpO2:  97%    General Examination: The patient is a very pleasant 52y.o. female in no acute distress. She appears well-developed and well-nourished and well groomed.   HEENT: Normocephalic, atraumatic, pupils are equal, round and reactive to  light, extraocular tracking is good without limitation to gaze excursion or nystagmus noted. Hearing is grossly intact. Face is symmetric with normal facial animation. Speech is clear with no dysarthria noted. There is no hypophonia. There is no lip, neck/head, jaw or voice tremor. Neck is supple with full range of passive and active motion. There are no carotid bruits on auscultation. Oropharynx exam reveals: mild mouth dryness, good dental hygiene and moderate airway crowding, due to small airway entry.  Mallampati is class III, tonsils smaller, uvula tip not fully visualized, tongue protrudes centrally.  Redundant soft palate noted, neck circumference 14-5/8 inches.  She has a minimal overbite.  Nasal inspection reveals no significant inferior turbinate hypertrophy, slight deviation to the left of her septum, narrow nasal passages overall.  Chest: Clear to auscultation without wheezing, rhonchi or crackles noted.  Heart: S1+S2+0, regular and normal without murmurs, rubs or gallops noted.   Abdomen: Soft, non-tender and non-distended.  Extremities: There is no pitting edema in the distal lower extremities bilaterally.   Skin: Warm and dry without trophic changes noted.   Musculoskeletal: exam reveals no obvious joint deformities.   Neurologically:  Mental status: The patient is awake, alert and oriented in all 4 spheres. Her immediate and remote memory, attention, language skills and fund of knowledge are appropriate. There is no evidence of aphasia, agnosia, apraxia or anomia. Speech is clear with normal prosody and enunciation. Thought process is linear. Mood is normal and affect is normal.  Cranial nerves II - XII are as described above under HEENT exam.  Motor exam: Normal bulk, strength and tone is noted. There is no obvious action or resting tremor.  Fine motor skills and coordination: grossly intact.  Cerebellar testing: No dysmetria or intention tremor. There is no truncal or gait  ataxia.  Sensory exam: intact to light touch in the upper and lower extremities.  Gait, station and balance: She stands easily. No veering to one side is noted. No leaning to one side is noted. Posture is age-appropriate and stance is narrow based. Gait shows normal stride length and normal pace. No problems turning are noted.   Assessment and Plan:   In summary, Elasia Derousse is a very pleasant 52 y.o.-year old female with an underlying medical history of diabetes, carpal tunnel syndrome, allergies, hypertension, PCOS, history of tachycardia, depression and severe obesity with a BMI of over 40, whose history and physical exam are concerning for sleep disordered breathing, supporting a current working diagnosis of unspecified sleep apnea, with the main differential diagnoses of obstructive sleep apnea (OSA) versus upper airway resistance syndrome (UARS) versus central sleep apnea (CSA), or mixed sleep apnea. A laboratory attended sleep study is typically considered "gold standard" for evaluation of sleep disordered breathing.   I had a long chat with the patient about my findings and the diagnosis of sleep apnea  particularly OSA, its prognosis and treatment options. We talked about medical/conservative treatments, surgical interventions and non-pharmacological approaches for symptom control. I explained, in particular, the risks and ramifications of untreated moderate to severe OSA, especially with respect to developing cardiovascular disease down the road, including congestive heart failure (CHF), difficult to treat hypertension, cardiac arrhythmias (particularly A-fib), neurovascular complications including TIA, stroke and dementia. Even type 2 diabetes has, in part, been linked to untreated OSA. Symptoms of untreated OSA may include (but may not be limited to) daytime sleepiness, nocturia (i.e. frequent nighttime urination), memory problems, mood irritability and suboptimally controlled or worsening mood  disorder such as depression and/or anxiety, lack of energy, lack of motivation, physical discomfort, as well as recurrent headaches, especially morning or nocturnal headaches. We talked about the importance of maintaining a healthy lifestyle and striving for healthy weight. In addition, we talked about the importance of striving for and maintaining good sleep hygiene. I recommended a sleep study at this time. I outlined the differences between a laboratory attended sleep study which is considered more comprehensive and accurate over the option of a home  sleep test (HST); the latter may lead to underestimation of sleep disordered breathing in some instances and does not help with diagnosing upper airway resistance syndrome and is not accurate enough to diagnose primary central sleep apnea typically. I outlined possible surgical and non-surgical treatment options of OSA, including the use of a positive airway pressure (PAP) device (i.e. CPAP, AutoPAP/APAP or BiPAP in certain circumstances), a custom-made dental device (aka oral appliance, which would require a referral to a specialist dentist or orthodontist typically, and is generally speaking not considered for patients with full dentures or edentulous state), upper airway surgical options, such as traditional UPPP (which is not considered a first-line treatment) or the Inspire device (hypoglossal nerve stimulator, which would involve a referral for consultation with an ENT surgeon, after careful selection, following inclusion criteria - also not first-line treatment). I explained the PAP treatment option to the patient in detail, as this is generally considered first-line treatment.  The patient indicated that she would be willing to try PAP therapy, if the need arises. I explained the importance of being compliant with PAP treatment, not only for insurance purposes but primarily to improve patient's symptoms symptoms, and for the patient's long term health  benefit, including to reduce Her cardiovascular risks longer-term.    We will pick up our discussion about the next steps and treatment options after testing.  We will keep her posted as to the test results by phone call and/or MyChart messaging where possible.  We will plan to follow-up in sleep clinic accordingly as well.  I answered all her questions today and the patient was in agreement.   I encouraged her to call with any interim questions, concerns, problems or updates or email [place] through [place].  Generally speaking, sleep test authorizations may take up to 2 weeks, sometimes less, sometimes longer, the patient is encouraged to get in touch with [place] if they do not hear back from the sleep lab staff directly within the next 2 weeks.  Thank you very much for allowing me to participate in the care of this nice patient. If I can be of any further assistance to you please do not hesitate to call me at [phone].  Sincerely,   Star Age, MD, PhD

## 2022-04-06 ENCOUNTER — Other Ambulatory Visit: Payer: Self-pay | Admitting: Family Medicine

## 2022-04-06 ENCOUNTER — Encounter: Payer: BC Managed Care – PPO | Admitting: Nurse Practitioner

## 2022-04-06 DIAGNOSIS — I10 Essential (primary) hypertension: Secondary | ICD-10-CM

## 2022-04-06 NOTE — Progress Notes (Signed)
Because your symptoms have been present for longer than one week and we have noted a new medication that may relate to these symptoms, I feel your condition warrants further evaluation and I recommend that you be seen for a face to face visit.  Please contact your primary care physician practice to be seen. Many offices offer virtual options to be seen via video if you are not comfortable going in person to a medical facility at this time.  NOTE: You will NOT be charged for this eVisit.  If you do not have a PCP, [facility] offers a free physician referral service available at [phone]. Our trained staff has the experience, knowledge and resources to put you in touch with a physician who is right for you.    If you are having a true medical emergency please call 911.   Your e-visit answers were reviewed by a board certified advanced clinical practitioner to complete your personal care plan.  Thank you for using e-Visits.

## 2022-04-08 ENCOUNTER — Encounter: Payer: Self-pay | Admitting: Family Medicine

## 2022-04-08 ENCOUNTER — Telehealth: Payer: BC Managed Care – PPO | Admitting: Family Medicine

## 2022-04-08 VITALS — Ht 67.0 in | Wt 253.0 lb

## 2022-04-08 DIAGNOSIS — R197 Diarrhea, unspecified: Secondary | ICD-10-CM

## 2022-04-08 NOTE — Progress Notes (Signed)
Virtual Visit via Video   I connected with patient on 04/08/22 at  9:00 AM EST by a video enabled telemedicine application and verified that I am speaking with the correct person using two identifiers.  Location patient: Home Location provider: [facility] Persons participating in the virtual visit: Patient, Provider, Andrews Marcille Blanco C)  I discussed the limitations of evaluation and management by telemedicine and the availability of in person appointments. The patient expressed understanding and agreed to proceed.  Subjective:   HPI:   Diarrhea- sxs started at the beginning of the month.  At that time she started going to a weight loss clinic and receiving B12 shots Q2 weeks.  Also started Magnesium supplement.  Pt is having loose/watery stools multiple times a day.  No abd pain.  No cramping or nausea.  No drastic change in diet.  Is on Metformin but has been on that for over 10 yrs w/o difficulty. 'i feel fine'   ROS:   See pertinent positives and negatives per HPI.  Patient Active Problem List   Diagnosis Date Noted   Type 2 diabetes mellitus with complication, without long-term current use of insulin ([place]) 04/18/2021   SVT (supraventricular tachycardia) 04/18/2021   Morbid obesity ([place]) 10/04/2019   Essential hypertension 10/04/2019   PCOS (polycystic ovarian syndrome) 11/08/2017   Physical exam 05/15/2015   HEADACHE 10/27/2008    Social History   Tobacco Use   Smoking status: Never   Smokeless tobacco: Never  Substance Use Topics   Alcohol use: Yes    Comment: social (no t )often 1-2 year    Current Outpatient Medications:    albuterol (VENTOLIN HFA) 108 (90 Base) MCG/ACT inhaler, Inhale 2 puffs into the lungs every 6 (six) hours as needed for wheezing or shortness of breath., Disp: 18 g, Rfl: 1   azelastine (ASTELIN) 0.1 % nasal spray, Place 2 sprays into both nostrils 2 (two) times daily. Use in each nostril as directed (Patient taking differently:  Place 2 sprays into both nostrils 2 (two) times daily as needed. Use in each nostril as directed), Disp: 30 mL, Rfl: 12   losartan (COZAAR) 50 MG tablet, TAKE 1 TABLET BY MOUTH EVERY DAY, Disp: 90 tablet, Rfl: 0   metFORMIN (GLUCOPHAGE) 500 MG tablet, TAKE 1 TABLET (500 MG TOTAL) BY MOUTH 2 (TWO) TIMES DAILY. PT TAKES FOR PCOS, Disp: 180 tablet, Rfl: 1   metoprolol succinate (TOPROL-XL) 25 MG 24 hr tablet, Take 1 tablet (25 mg total) by mouth daily., Disp: 90 tablet, Rfl: 3   SUMAtriptan (IMITREX) 50 MG tablet, Take 1 tablet (50 mg total) by mouth every 2 (two) hours as needed for migraine. May repeat in 2 hours if headache persists or recurs., Disp: 10 tablet, Rfl: 3   Vitamin D, Ergocalciferol, (DRISDOL) 1.25 MG (50000 UNIT) CAPS capsule, Take 1 capsule (50,000 Units total) by mouth every 7 (seven) days., Disp: 7 capsule, Rfl: 12   montelukast (SINGULAIR) 10 MG tablet, Take 10 mg by mouth daily. (Patient not taking: Reported on 04/08/2022), Disp: , Rfl:    phentermine (ADIPEX-P) 37.5 MG tablet, Take 37.5 mg by mouth daily. (Patient not taking: Reported on 04/08/2022), Disp: , Rfl:   Allergies  Allergen Reactions   Codeine Hives and Itching    Objective:   Ht '5\' 7"'$  (1.702 m)   Wt 253 lb (114.8 kg)   BMI 39.63 kg/m  AAOx3, NAD NCAT, EOMI No obvious CN deficits Coloring WNL Pt is able to speak clearly,  coherently without shortness of breath or increased work of breathing.  Thought process is linear.  Mood is appropriate.   Assessment and Plan:   Diarrhea- new.  Pt reports diarrhea started when she began B12 injections and magnesium supplement.  Discussed that both of these can have a laxative effect.  Pt was unaware of this.  She will stop the magnesium supplement and see if sxs improve.  If not, will then stop B12 injections.  If sxs still persist, will do stool studies and/or GI referral to address.  Pt expressed understanding and is in agreement w/ plan.    Annye Asa,  MD 04/08/2022

## 2022-04-15 DIAGNOSIS — R7309 Other abnormal glucose: Secondary | ICD-10-CM | POA: Diagnosis not present

## 2022-04-26 ENCOUNTER — Telehealth: Payer: Self-pay

## 2022-04-26 NOTE — Telephone Encounter (Signed)
[employer] no auth req spoke to [place] T ref # [id number]

## 2022-04-26 NOTE — Telephone Encounter (Signed)
LVM for pt to call back to schedule sleep study.  

## 2022-04-29 DIAGNOSIS — G5601 Carpal tunnel syndrome, right upper limb: Secondary | ICD-10-CM | POA: Diagnosis not present

## 2022-05-05 NOTE — Telephone Encounter (Signed)
LVM for pt to call back to schedule sleep study.   We have attempted to call the patient two times to schedule sleep study.  Patient has been unavailable at the phone numbers we have on file and has not returned our calls.  If patient calls back we will schedule them for their sleep study.  

## 2022-05-12 DIAGNOSIS — G5601 Carpal tunnel syndrome, right upper limb: Secondary | ICD-10-CM | POA: Diagnosis not present

## 2022-05-16 DIAGNOSIS — R7309 Other abnormal glucose: Secondary | ICD-10-CM | POA: Diagnosis not present

## 2022-06-05 ENCOUNTER — Other Ambulatory Visit: Payer: Self-pay | Admitting: Family Medicine

## 2022-06-10 DIAGNOSIS — G5601 Carpal tunnel syndrome, right upper limb: Secondary | ICD-10-CM | POA: Diagnosis not present

## 2022-06-27 ENCOUNTER — Encounter: Payer: Self-pay | Admitting: Family Medicine

## 2022-07-07 ENCOUNTER — Other Ambulatory Visit: Payer: Self-pay | Admitting: Family Medicine

## 2022-07-07 DIAGNOSIS — I10 Essential (primary) hypertension: Secondary | ICD-10-CM

## 2022-07-08 DIAGNOSIS — M25572 Pain in left ankle and joints of left foot: Secondary | ICD-10-CM | POA: Diagnosis not present

## 2022-08-17 ENCOUNTER — Ambulatory Visit: Payer: BC Managed Care – PPO | Admitting: Family Medicine

## 2022-08-29 ENCOUNTER — Ambulatory Visit: Payer: BC Managed Care – PPO | Admitting: Family Medicine

## 2022-08-29 VITALS — BP 138/84 | HR 75 | Temp 97.8°F | Resp 18 | Ht 67.0 in | Wt 268.2 lb

## 2022-08-29 DIAGNOSIS — Z6841 Body Mass Index (BMI) 40.0 and over, adult: Secondary | ICD-10-CM

## 2022-08-29 DIAGNOSIS — E118 Type 2 diabetes mellitus with unspecified complications: Secondary | ICD-10-CM

## 2022-08-29 DIAGNOSIS — I10 Essential (primary) hypertension: Secondary | ICD-10-CM

## 2022-08-29 DIAGNOSIS — Z7984 Long term (current) use of oral hypoglycemic drugs: Secondary | ICD-10-CM

## 2022-08-29 LAB — LIPID PANEL
Cholesterol: 163 mg/dL (ref 0–200)
HDL: 61.8 mg/dL (ref >39.00)
LDL Cholesterol: 72 mg/dL (ref 0–99)
NonHDL: 101.24
Total CHOL/HDL Ratio: 3
Triglycerides: 148 mg/dL (ref 0.0–149.0)
VLDL: 29.6 mg/dL (ref 0.0–40.0)

## 2022-08-29 LAB — CBC WITH DIFFERENTIAL/PLATELET
Basophils Absolute: 0 10*3/uL (ref 0.0–0.1)
Basophils Relative: 0.5 % (ref 0.0–3.0)
Eosinophils Absolute: 0.2 10*3/uL (ref 0.0–0.7)
Eosinophils Relative: 4.1 % (ref 0.0–5.0)
HCT: 39.8 % (ref 36.0–46.0)
Hemoglobin: 12.9 g/dL (ref 12.0–15.0)
Lymphocytes Relative: 27.4 % (ref 12.0–46.0)
Lymphs Abs: 1.5 10*3/uL (ref 0.7–4.0)
MCHC: 32.3 g/dL (ref 30.0–36.0)
MCV: 89.1 fl (ref 78.0–100.0)
Monocytes Absolute: 0.5 10*3/uL (ref 0.1–1.0)
Monocytes Relative: 8.3 % (ref 3.0–12.0)
Neutro Abs: 3.3 10*3/uL (ref 1.4–7.7)
Neutrophils Relative %: 59.7 % (ref 43.0–77.0)
Platelets: 282 10*3/uL (ref 150.0–400.0)
RBC: 4.47 Mil/uL (ref 3.87–5.11)
RDW: 14.4 % (ref 11.5–15.5)
WBC: 5.5 10*3/uL (ref 4.0–10.5)

## 2022-08-29 LAB — BASIC METABOLIC PANEL
BUN: 14 mg/dL (ref 6–23)
CO2: 27 mEq/L (ref 19–32)
Calcium: 9.6 mg/dL (ref 8.4–10.5)
Chloride: 103 mEq/L (ref 96–112)
Creatinine, Ser: 0.77 mg/dL (ref 0.40–1.20)
GFR: 90.49 mL/min (ref >60.00)
Glucose, Bld: 112 mg/dL — ABNORMAL HIGH (ref 70–99)
Potassium: 4.7 mEq/L (ref 3.5–5.1)
Sodium: 137 mEq/L (ref 135–145)

## 2022-08-29 LAB — HEPATIC FUNCTION PANEL
ALT: 14 U/L (ref 0–35)
AST: 13 U/L (ref 0–37)
Albumin: 3.9 g/dL (ref 3.5–5.2)
Alkaline Phosphatase: 121 U/L — ABNORMAL HIGH (ref 39–117)
Bilirubin, Direct: 0.1 mg/dL (ref 0.0–0.3)
Total Bilirubin: 0.4 mg/dL (ref 0.2–1.2)
Total Protein: 6.9 g/dL (ref 6.0–8.3)

## 2022-08-29 LAB — HEMOGLOBIN A1C: Hgb A1c MFr Bld: 6.2 % (ref 4.6–6.5)

## 2022-08-29 LAB — TSH: TSH: 2.14 u[IU]/mL (ref 0.35–5.50)

## 2022-08-29 MED ORDER — OZEMPIC (0.25 OR 0.5 MG/DOSE) 2 MG/3ML ~~LOC~~ SOPN: 0.2500 mg | PEN_INJECTOR | SUBCUTANEOUS | 1 refills | Status: DC

## 2022-08-29 NOTE — Progress Notes (Signed)
   Subjective:    Patient ID: Carol Houston, female    DOB: 01-22-1971, 52 y.o.   MRN: [id number]  HPI HTN- chronic problem, on Losartan 50mg  daily, Metoprolol 25mg  daily w/ adequate control.  Pt reports feeling 'great'.  No CP, SOB, HA's, visual changes, edema.    DM- ongoing issue for pt.  Currently on Metformin 500mg  BID.  UTD on microalbumin, UTD on eye exam.  Due for foot exam.  No symptomatic lows.  No numbness/tingling of hands/feet.  Obesity- pt has gained 15 lbs since Feb.  BMI now 42.  Pt is interested in GLP1.  Was given phentermine but never took it.   Review of Systems For ROS see HPI     Objective:   Physical Exam Vitals reviewed.  Constitutional:      General: She is not in acute distress.    Appearance: Normal appearance. She is well-developed. She is obese. She is not ill-appearing.  HENT:     Head: Normocephalic and atraumatic.  Eyes:     Conjunctiva/sclera: Conjunctivae normal.     Pupils: Pupils are equal, round, and reactive to light.  Neck:     Thyroid: No thyromegaly.  Cardiovascular:     Rate and Rhythm: Normal rate and regular rhythm.     Pulses: Normal pulses.     Heart sounds: Normal heart sounds. No murmur heard. Pulmonary:     Effort: Pulmonary effort is normal. No respiratory distress.     Breath sounds: Normal breath sounds.  Abdominal:     General: There is no distension.     Palpations: Abdomen is soft.     Tenderness: There is no abdominal tenderness.  Musculoskeletal:     Cervical back: Normal range of motion and neck supple.     Right lower leg: No edema.     Left lower leg: No edema.  Lymphadenopathy:     Cervical: No cervical adenopathy.  Skin:    General: Skin is warm and dry.  Neurological:     General: No focal deficit present.     Mental Status: She is alert and oriented to person, place, and time.  Psychiatric:        Mood and Affect: Mood normal.        Behavior: Behavior normal.        Thought Content: Thought content  normal.           Assessment & Plan:

## 2022-08-29 NOTE — Assessment & Plan Note (Signed)
Chronic problem.  On Losartan 50mg  daily and Metoprolol 25mg  daily w/ adequate control.  Currently asymptomatic.  Check labs due to ARB use but no anticipated med changes.

## 2022-08-29 NOTE — Patient Instructions (Signed)
Schedule your complete physical in 6 months We'll notify you of your lab results and make any changes if needed Continue to work on healthy diet and regular exercise- you can do it!! We'll try and get the Ozempic approved and get it started Call with any questions or concerns Stay Safe!  Stay Healthy! Have a great summer!!!

## 2022-08-29 NOTE — Assessment & Plan Note (Signed)
Deteriorated.  Pt has gained 15 lbs since Feb.  BMI now 42.  She wants to start Ozempic for both her diabetes and her weight.  Will attempt to get this approved.

## 2022-08-29 NOTE — Assessment & Plan Note (Signed)
Ongoing issue.  Pt is tolerating Metformin w/o difficulty but would like additional glycemic control and weight support w/ GLP1.  Will try and get Ozempic approved.  Check labs.  Adjust meds prn

## 2022-10-09 ENCOUNTER — Other Ambulatory Visit: Payer: Self-pay | Admitting: Family Medicine

## 2022-10-09 DIAGNOSIS — I10 Essential (primary) hypertension: Secondary | ICD-10-CM

## 2022-10-20 ENCOUNTER — Other Ambulatory Visit: Payer: Self-pay | Admitting: Family Medicine

## 2022-11-15 DIAGNOSIS — R7309 Other abnormal glucose: Secondary | ICD-10-CM | POA: Diagnosis not present

## 2022-12-06 ENCOUNTER — Other Ambulatory Visit: Payer: Self-pay | Admitting: Family Medicine

## 2022-12-16 DIAGNOSIS — R7309 Other abnormal glucose: Secondary | ICD-10-CM | POA: Diagnosis not present

## 2022-12-20 ENCOUNTER — Other Ambulatory Visit: Payer: Self-pay | Admitting: Family Medicine

## 2023-01-08 ENCOUNTER — Other Ambulatory Visit: Payer: Self-pay | Admitting: Family Medicine

## 2023-01-08 DIAGNOSIS — I10 Essential (primary) hypertension: Secondary | ICD-10-CM

## 2023-01-15 DIAGNOSIS — R7309 Other abnormal glucose: Secondary | ICD-10-CM | POA: Diagnosis not present

## 2023-02-15 DIAGNOSIS — R7309 Other abnormal glucose: Secondary | ICD-10-CM | POA: Diagnosis not present

## 2023-03-03 ENCOUNTER — Encounter: Payer: Self-pay | Admitting: Family Medicine

## 2023-03-03 ENCOUNTER — Ambulatory Visit (INDEPENDENT_AMBULATORY_CARE_PROVIDER_SITE_OTHER): Payer: BC Managed Care – PPO | Admitting: Family Medicine

## 2023-03-03 VITALS — BP 124/82 | HR 83 | Temp 98.2°F | Ht 67.0 in | Wt 246.2 lb

## 2023-03-03 DIAGNOSIS — Z7985 Long-term (current) use of injectable non-insulin antidiabetic drugs: Secondary | ICD-10-CM

## 2023-03-03 DIAGNOSIS — E118 Type 2 diabetes mellitus with unspecified complications: Secondary | ICD-10-CM

## 2023-03-03 DIAGNOSIS — Z0000 Encounter for general adult medical examination without abnormal findings: Secondary | ICD-10-CM

## 2023-03-03 DIAGNOSIS — Z1159 Encounter for screening for other viral diseases: Secondary | ICD-10-CM | POA: Diagnosis not present

## 2023-03-03 LAB — CBC WITH DIFFERENTIAL/PLATELET
Basophils Absolute: 0 K/uL (ref 0.0–0.1)
Basophils Relative: 0.7 % (ref 0.0–3.0)
Eosinophils Absolute: 0.2 K/uL (ref 0.0–0.7)
Eosinophils Relative: 3 % (ref 0.0–5.0)
HCT: 39.8 % (ref 36.0–46.0)
Hemoglobin: 12.6 g/dL (ref 12.0–15.0)
Lymphocytes Relative: 28 % (ref 12.0–46.0)
Lymphs Abs: 1.6 K/uL (ref 0.7–4.0)
MCHC: 31.7 g/dL (ref 30.0–36.0)
MCV: 80.7 fl (ref 78.0–100.0)
Monocytes Absolute: 0.5 K/uL (ref 0.1–1.0)
Monocytes Relative: 8.8 % (ref 3.0–12.0)
Neutro Abs: 3.3 K/uL (ref 1.4–7.7)
Neutrophils Relative %: 59.5 % (ref 43.0–77.0)
Platelets: 364 K/uL (ref 150.0–400.0)
RBC: 4.93 Mil/uL (ref 3.87–5.11)
RDW: 16 % — ABNORMAL HIGH (ref 11.5–15.5)
WBC: 5.6 K/uL (ref 4.0–10.5)

## 2023-03-03 LAB — HEPATIC FUNCTION PANEL
ALT: 13 U/L (ref 0–35)
AST: 15 U/L (ref 0–37)
Albumin: 4.3 g/dL (ref 3.5–5.2)
Alkaline Phosphatase: 151 U/L — ABNORMAL HIGH (ref 39–117)
Bilirubin, Direct: 0.1 mg/dL (ref 0.0–0.3)
Total Bilirubin: 0.5 mg/dL (ref 0.2–1.2)
Total Protein: 7.4 g/dL (ref 6.0–8.3)

## 2023-03-03 LAB — LIPID PANEL
Cholesterol: 152 mg/dL (ref 0–200)
HDL: 59.6 mg/dL (ref >39.00)
LDL Cholesterol: 73 mg/dL (ref 0–99)
NonHDL: 92.42
Total CHOL/HDL Ratio: 3
Triglycerides: 98 mg/dL (ref 0.0–149.0)
VLDL: 19.6 mg/dL (ref 0.0–40.0)

## 2023-03-03 LAB — BASIC METABOLIC PANEL
BUN: 12 mg/dL (ref 6–23)
CO2: 27 meq/L (ref 19–32)
Calcium: 9.3 mg/dL (ref 8.4–10.5)
Chloride: 102 meq/L (ref 96–112)
Creatinine, Ser: 0.83 mg/dL (ref 0.40–1.20)
GFR: 82.4 mL/min (ref >60.00)
Glucose, Bld: 95 mg/dL (ref 70–99)
Potassium: 4.4 meq/L (ref 3.5–5.1)
Sodium: 136 meq/L (ref 135–145)

## 2023-03-03 LAB — VITAMIN D 25 HYDROXY (VIT D DEFICIENCY, FRACTURES): VITD: 64.94 ng/mL (ref 30.00–100.00)

## 2023-03-03 LAB — MICROALBUMIN / CREATININE URINE RATIO
Creatinine,U: 35.5 mg/dL
Microalb Creat Ratio: 2 mg/g (ref 0.0–30.0)
Microalb, Ur: <0.7 mg/dL (ref 0.0–1.9)

## 2023-03-03 LAB — TSH: TSH: 1.72 u[IU]/mL (ref 0.35–5.50)

## 2023-03-03 LAB — HEMOGLOBIN A1C: Hgb A1c MFr Bld: 6 % (ref 4.6–6.5)

## 2023-03-03 MED ORDER — SEMAGLUTIDE (1 MG/DOSE) 4 MG/3ML ~~LOC~~ SOPN: 1.0000 mg | PEN_INJECTOR | SUBCUTANEOUS | 3 refills | Status: DC

## 2023-03-03 NOTE — Patient Instructions (Addendum)
Follow up in 6 months to recheck sugar and blood We'll notify you of your lab results and make any changes if needed Continue to work on healthy diet and regular exercise- you look great! INCREASE the Ozempic to 1mg  weekly STOP the Metformin Call with any questions or concerns Stay Safe!  Stay Healthy! Happy New Year!

## 2023-03-03 NOTE — Assessment & Plan Note (Signed)
She is down 22 lbs!  Applauded her efforts and encouraged her to continue.  Will follow.

## 2023-03-03 NOTE — Assessment & Plan Note (Signed)
Chronic problem.  Will increase Ozempic to 1mg  weekly as pt has been doing 0.5mg  weekly for the last 6 weeks.  Since we are increasing the Ozempic, will stop the Metformin as she is having nausea w/ it.  UTD on eye exam, foot exam.  Microalbumin ordered.

## 2023-03-03 NOTE — Progress Notes (Signed)
   Subjective:    Patient ID: Carol Houston, female    DOB: 05-20-1970, 53 y.o.   MRN: [id number]  HPI CPE- mammo is scheduled.  UTD on eye exam.  UTD on pap, colonoscopy, foot exam.  Due for microalbumin.  Patient Care Team    Relationship Specialty Notifications Start End  Sheliah Hatch, MD PCP - General   01/27/10   Rollene Rotunda, MD PCP - Cardiology Cardiology  01/28/21   Noland Fordyce, MD Consulting Physician Obstetrics and Gynecology  04/27/16     Health Maintenance  Topic Date Due   Pneumococcal Vaccine 50-68 Years old (1 of 2 - PCV) Never done   Hepatitis C Screening  Never done   INFLUENZA VACCINE  09/15/2022   MAMMOGRAM  02/06/2023   Zoster Vaccines- Shingrix (1 of 2) Never done   Diabetic kidney evaluation - Urine ACR  02/17/2023   OPHTHALMOLOGY EXAM  02/25/2023   HEMOGLOBIN A1C  03/01/2023   Cervical Cancer Screening (HPV/Pap Cotest)  05/01/2023   Diabetic kidney evaluation - eGFR measurement  08/29/2023   FOOT EXAM  08/29/2023   DTaP/Tdap/Td (2 - Td or Tdap) 09/17/2024   Colonoscopy  04/25/2027   HIV Screening  Completed   HPV VACCINES  Aged Out   COVID-19 Vaccine  Discontinued     Review of Systems Patient reports no vision/ hearing changes, adenopathy,fever,  persistant/recurrent hoarseness , swallowing issues, chest pain, palpitations, edema, persistant/recurrent cough, hemoptysis, dyspnea (rest/exertional/paroxysmal nocturnal), gastrointestinal bleeding (melena, rectal bleeding), abdominal pain, significant heartburn, bowel changes, GU symptoms (dysuria, hematuria, incontinence), Gyn symptoms (abnormal  bleeding, pain),  syncope, focal weakness, memory loss, numbness & tingling, skin/hair/nail changes, abnormal bruising or bleeding, anxiety, or depression.   + 22 lb weight loss    Objective:   Physical Exam General Appearance:    Alert, cooperative, no distress, appears stated age, obese  Head:    Normocephalic, without obvious abnormality, atraumatic   Eyes:    PERRL, conjunctiva/corneas clear, EOM's intact both eyes  Ears:    Normal TM's and external ear canals, both ears  Nose:   Nares normal, septum midline, mucosa normal, no drainage    or sinus tenderness  Throat:   Lips, mucosa, and tongue normal; teeth and gums normal  Neck:   Supple, symmetrical, trachea midline, no adenopathy;    Thyroid: no enlargement/tenderness/nodules  Back:     Symmetric, no curvature, ROM normal, no CVA tenderness  Lungs:     Clear to auscultation bilaterally, respirations unlabored  Chest Wall:    No tenderness or deformity   Heart:    Regular rate and rhythm, S1 and S2 normal, no murmur, rub   or gallop  Breast Exam:    Deferred to GYN  Abdomen:     Soft, non-tender, bowel sounds active all four quadrants,    no masses, no organomegaly  Genitalia:    Deferred to GYN  Rectal:    Extremities:   Extremities normal, atraumatic, no cyanosis or edema  Pulses:   2+ and symmetric all extremities  Skin:   Skin color, texture, turgor normal, no rashes or lesions  Lymph nodes:   Cervical, supraclavicular, and axillary nodes normal  Neurologic:   CNII-XII intact, normal strength, sensation and reflexes    throughout          Assessment & Plan:

## 2023-03-03 NOTE — Assessment & Plan Note (Signed)
PE WNL w/ exception of BMI.  UTD on pap, colonoscopy.  Mammo scheduled.  Check labs.  Anticipatory guidance provided.

## 2023-03-04 LAB — HEPATITIS C ANTIBODY: Hepatitis C Ab: NONREACTIVE

## 2023-03-06 ENCOUNTER — Encounter: Payer: Self-pay | Admitting: Family Medicine

## 2023-03-06 ENCOUNTER — Telehealth: Payer: Self-pay

## 2023-03-06 NOTE — Telephone Encounter (Signed)
Pt has reviewed labs via MyChart

## 2023-03-06 NOTE — Telephone Encounter (Signed)
-----   Message from Neena Rhymes sent at 03/06/2023  7:48 AM EST ----- Your labs look great w/ the exception of elevated Alk Phos.  This has been fluctuating over the last 3 yrs and ultimately stable.  If still elevated at next check, we will do an abdominal ultrasound to ensure no issues with the liver.

## 2023-03-08 ENCOUNTER — Other Ambulatory Visit: Payer: Self-pay | Admitting: Family Medicine

## 2023-03-16 ENCOUNTER — Other Ambulatory Visit: Payer: Self-pay | Admitting: Family Medicine

## 2023-03-16 DIAGNOSIS — R7989 Other specified abnormal findings of blood chemistry: Secondary | ICD-10-CM

## 2023-03-16 NOTE — Telephone Encounter (Signed)
Patients most recent Vit D was in range, should patient continue Vitamin D at the prescription strength or should she switch to OTC?

## 2023-04-05 ENCOUNTER — Other Ambulatory Visit: Payer: Self-pay | Admitting: Family Medicine

## 2023-04-05 DIAGNOSIS — I10 Essential (primary) hypertension: Secondary | ICD-10-CM

## 2023-05-09 DIAGNOSIS — E119 Type 2 diabetes mellitus without complications: Secondary | ICD-10-CM | POA: Diagnosis not present

## 2023-06-11 ENCOUNTER — Other Ambulatory Visit: Payer: Self-pay | Admitting: Family Medicine

## 2023-06-25 ENCOUNTER — Other Ambulatory Visit: Payer: Self-pay | Admitting: Family Medicine

## 2023-07-01 ENCOUNTER — Other Ambulatory Visit: Payer: Self-pay | Admitting: Family Medicine

## 2023-07-01 DIAGNOSIS — I10 Essential (primary) hypertension: Secondary | ICD-10-CM

## 2023-08-29 DIAGNOSIS — Z1331 Encounter for screening for depression: Secondary | ICD-10-CM | POA: Diagnosis not present

## 2023-08-29 DIAGNOSIS — Z124 Encounter for screening for malignant neoplasm of cervix: Secondary | ICD-10-CM | POA: Diagnosis not present

## 2023-08-29 DIAGNOSIS — Z0142 Encounter for cervical smear to confirm findings of recent normal smear following initial abnormal smear: Secondary | ICD-10-CM | POA: Diagnosis not present

## 2023-08-29 DIAGNOSIS — Z1231 Encounter for screening mammogram for malignant neoplasm of breast: Secondary | ICD-10-CM | POA: Diagnosis not present

## 2023-08-29 DIAGNOSIS — Z01419 Encounter for gynecological examination (general) (routine) without abnormal findings: Secondary | ICD-10-CM | POA: Diagnosis not present

## 2023-08-29 DIAGNOSIS — Z01411 Encounter for gynecological examination (general) (routine) with abnormal findings: Secondary | ICD-10-CM | POA: Diagnosis not present

## 2023-08-29 LAB — HM MAMMOGRAPHY

## 2023-08-30 ENCOUNTER — Encounter: Payer: Self-pay | Admitting: Obstetrics

## 2023-09-07 ENCOUNTER — Ambulatory Visit: Payer: BC Managed Care – PPO | Admitting: Family Medicine

## 2023-10-06 ENCOUNTER — Ambulatory Visit: Admitting: Family Medicine

## 2023-10-06 ENCOUNTER — Other Ambulatory Visit: Payer: Self-pay | Admitting: Family Medicine

## 2023-10-06 DIAGNOSIS — I10 Essential (primary) hypertension: Secondary | ICD-10-CM

## 2023-10-08 IMAGING — CT CT CARDIAC CORONARY ARTERY CALCIUM SCORE
3 series · 14 of 20 positions shown, 16 images · non-contrast
Comparison: None.
COMPARISON: None.

Addendum:
EXAM:
OVER-READ INTERPRETATION  CT CHEST

The following report is an over-read performed by radiologist Dr.
Deangelo Borgen [REDACTED] on 02/11/2021. This
over-read does not include interpretation of cardiac or coronary
anatomy or pathology. The coronary calcium score interpretation by
the cardiologist is attached.
CLINICAL DATA: Cardiovascular Disease Risk stratification
Coronary Calcium Score
TECHNIQUE: A gated, non-contrast computed tomography scan of the heart was
performed using 3mm slice thickness. Axial images were analyzed on a
dedicated workstation. Calcium scoring of the coronary arteries was
performed using the Agatston method.

[Series 2: ax lung · axial · 0.94mm/px · z∈[+1210,+1300]mm · 5 of 69 slices shown]
[im 12/69  lung]
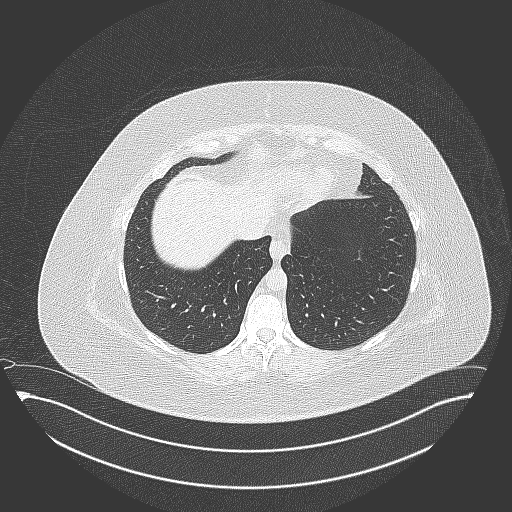
[im 23/69  lung]
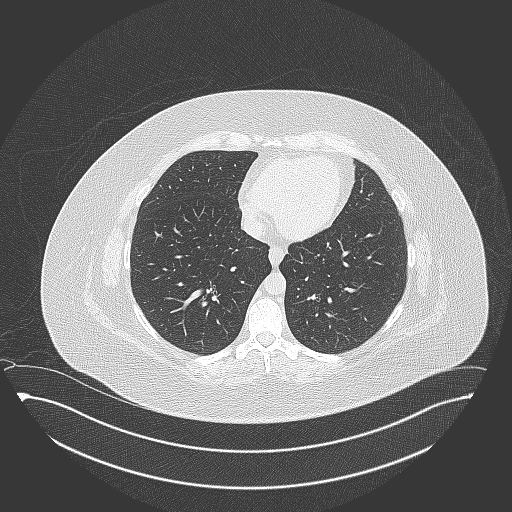
[im 35/69  lung]
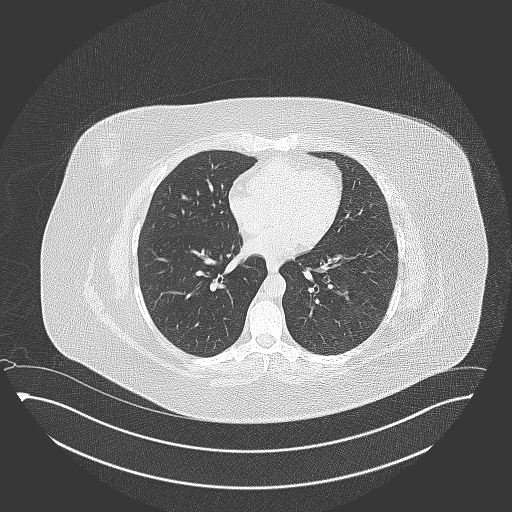
[im 46/69  lung]
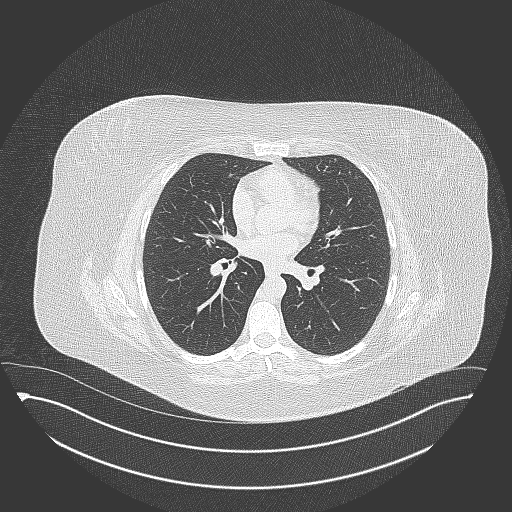
[im 57/69  lung]
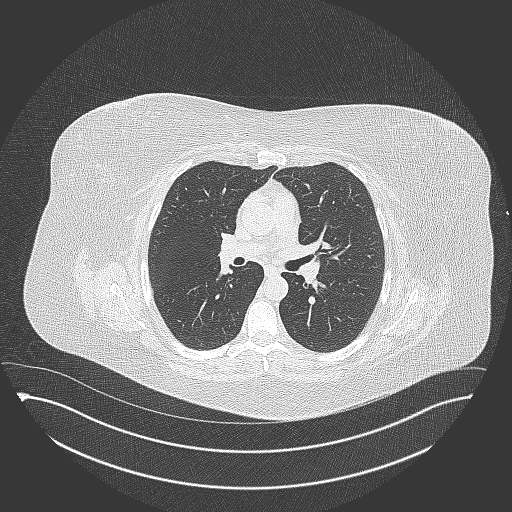

[Series 3: cascseq 3.0 sa36 70% (id) · axial · 0.39mm/px · z∈[+1222,+1288]mm · 3 of 46 slices shown]
[im 12/46  vessel]
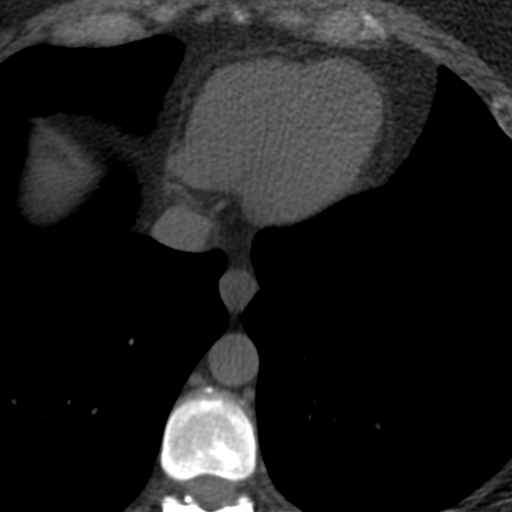
[im 23/46  vessel]
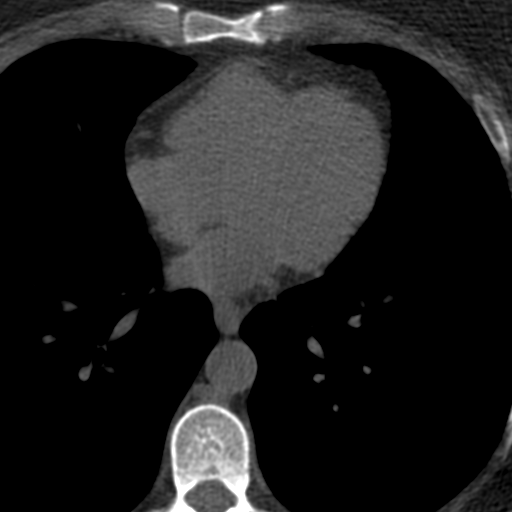
[im 34/46  vessel]
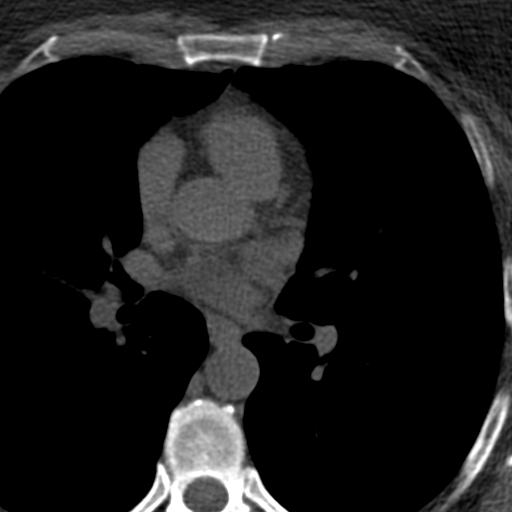

[Series 4: ax st · axial · 0.67mm/px · z∈[+1206,+1304]mm · 6 of 69 slices shown, 8 images]
[im 10/69  vessel]
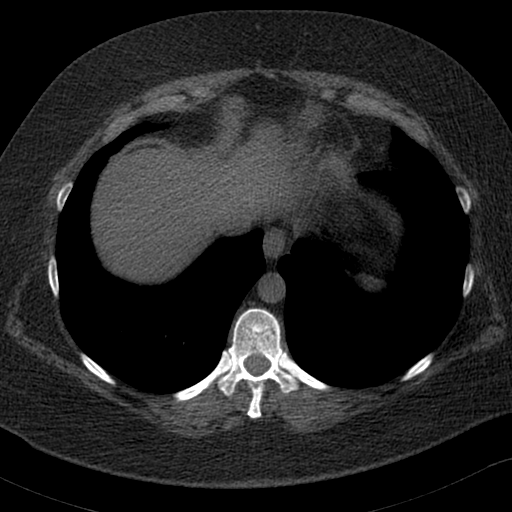
[im 10/69  lung]
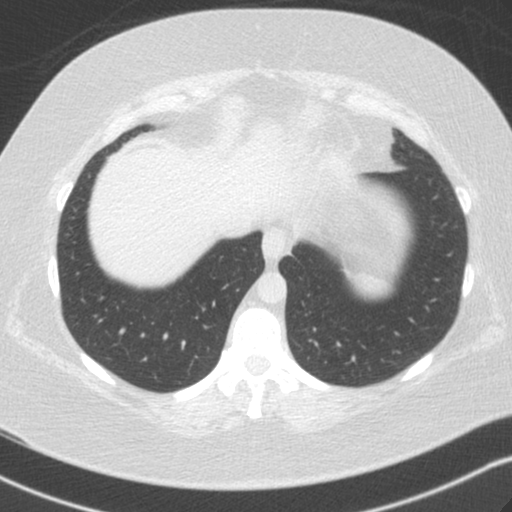
[im 20/69  vessel]
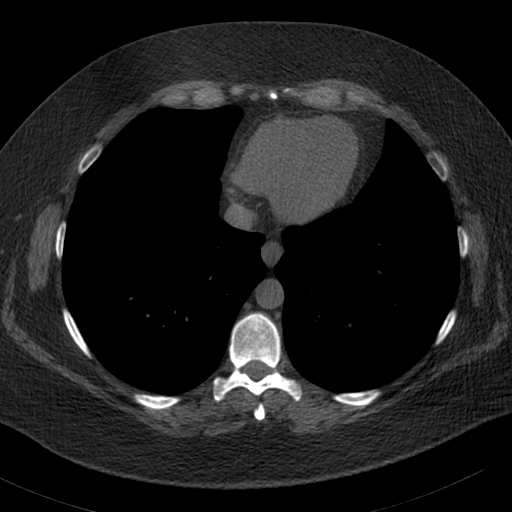
[im 30/69  vessel]
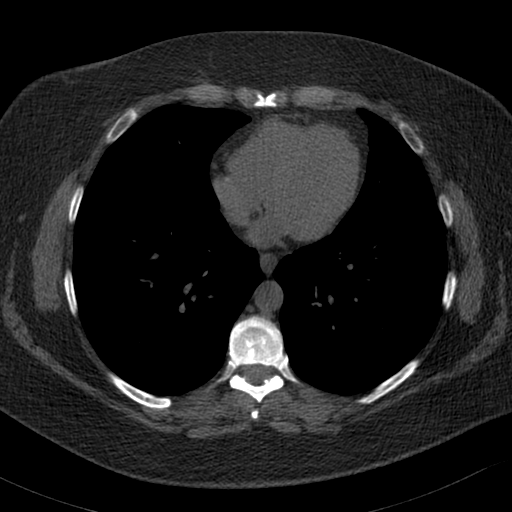
[im 39/69  vessel]
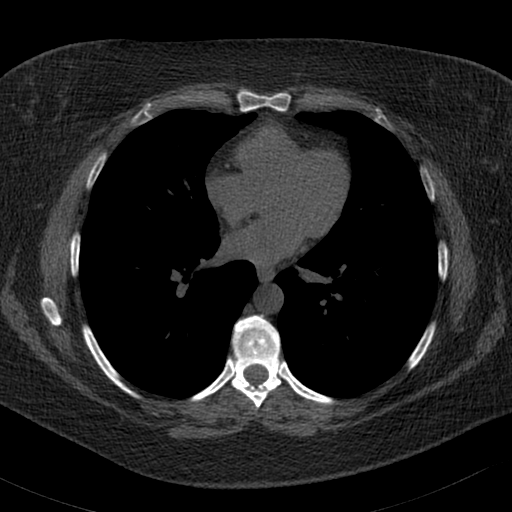
[im 49/69  vessel]
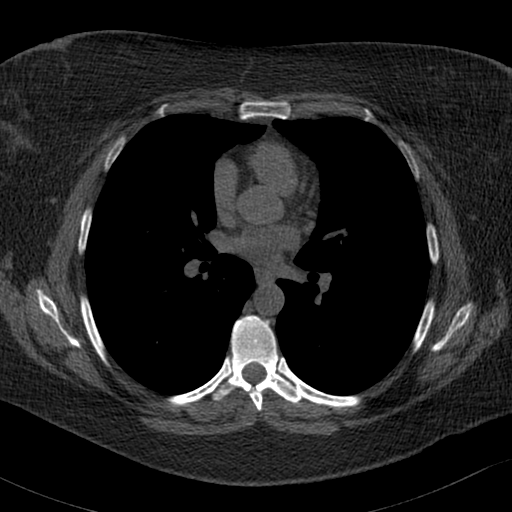
[im 49/69  lung]
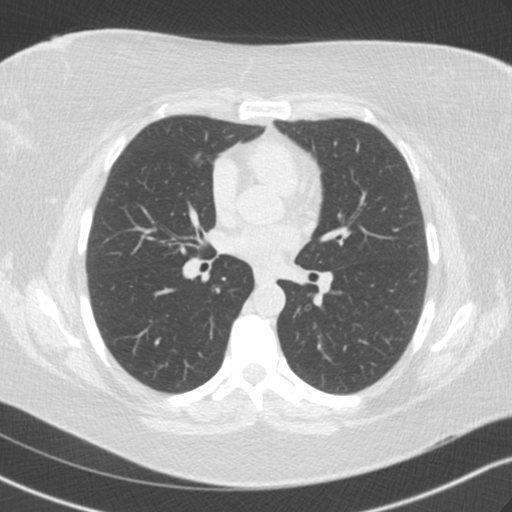
[im 59/69  vessel]
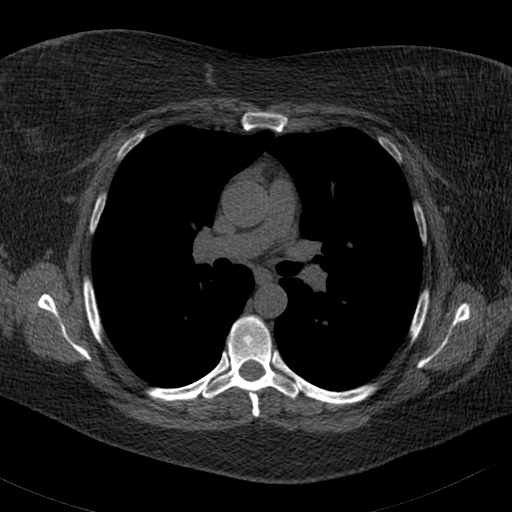

[14 of 20 positions shown; findings below may reference images not displayed]

FINDINGS: Vascular: No significant noncardiac vascular findings.

Mediastinum/Nodes: Visualized mediastinum and hilar regions
demonstrate no lymphadenopathy or masses.

Lungs/Pleura: Visualized lungs show no evidence of pulmonary edema,
consolidation, pneumothorax, nodule or pleural fluid.

Upper Abdomen: No acute abnormality.

Musculoskeletal: No chest wall mass or suspicious bone lesions
identified.
IMPRESSION: No significant incidental findings.
FINDINGS: Coronary arteries: Normal origins.

Coronary Calcium Score:

Left main: 0

Left anterior descending artery: 0

Left circumflex artery: 0

Right coronary artery: 0

Total: 0

Percentile: 0

Pericardium: Normal.

Ascending Aorta: Normal caliber.

Non-cardiac: See separate report from [REDACTED].
IMPRESSION: Coronary calcium score of 0. This is a low risk study.



If CAC=0, it is reasonable to withhold statin therapy and reassess
in 5 to 10 years, as long as higher risk conditions are absent
(diabetes mellitus, family history of premature CHD in first degree
relatives (males <55 years; females <65 years), cigarette smoking,
or LDL >=190 mg/dL).

If CAC is 1 to 99, it is reasonable to initiate statin therapy for
patients >=55 years of age.

If CAC is >=100 or >=75th percentile, it is reasonable to initiate
statin therapy at any age.

Cardiology referral should be considered for patients with CAC
scores >=400 or >=75th percentile.

*0027 AHA/ACC/AACVPR/AAPA/ABC/PATSY/SUECIA/MIM AKTHER/[place]/EYOR/SAIMA/JARICHA
Guideline on the Management of Blood Cholesterol: A Report of the
American College of Cardiology/American Heart Association Task Force
on Clinical Practice Guidelines. J Am Coll Cardiol.
4765;73(24):2500-2734.

*** End of Addendum ***
EXAM:
OVER-READ INTERPRETATION  CT CHEST

The following report is an over-read performed by radiologist Dr.
Deangelo Borgen [REDACTED] on 02/11/2021. This
over-read does not include interpretation of cardiac or coronary
anatomy or pathology. The coronary calcium score interpretation by
the cardiologist is attached.
FINDINGS: Vascular: No significant noncardiac vascular findings.

Mediastinum/Nodes: Visualized mediastinum and hilar regions
demonstrate no lymphadenopathy or masses.

Lungs/Pleura: Visualized lungs show no evidence of pulmonary edema,
consolidation, pneumothorax, nodule or pleural fluid.

Upper Abdomen: No acute abnormality.

Musculoskeletal: No chest wall mass or suspicious bone lesions
identified.
IMPRESSION: No significant incidental findings.

## 2023-10-11 DIAGNOSIS — R87610 Atypical squamous cells of undetermined significance on cytologic smear of cervix (ASC-US): Secondary | ICD-10-CM | POA: Insufficient documentation

## 2023-10-12 ENCOUNTER — Ambulatory Visit: Payer: Self-pay | Admitting: Family Medicine

## 2023-10-12 ENCOUNTER — Ambulatory Visit: Admitting: Family Medicine

## 2023-10-12 ENCOUNTER — Encounter: Payer: Self-pay | Admitting: Family Medicine

## 2023-10-12 VITALS — BP 128/68 | HR 82 | Temp 98.0°F | Ht 67.0 in | Wt 253.1 lb

## 2023-10-12 DIAGNOSIS — N951 Menopausal and female climacteric states: Secondary | ICD-10-CM

## 2023-10-12 DIAGNOSIS — Z7984 Long term (current) use of oral hypoglycemic drugs: Secondary | ICD-10-CM

## 2023-10-12 DIAGNOSIS — I10 Essential (primary) hypertension: Secondary | ICD-10-CM

## 2023-10-12 DIAGNOSIS — E118 Type 2 diabetes mellitus with unspecified complications: Secondary | ICD-10-CM

## 2023-10-12 LAB — BASIC METABOLIC PANEL WITH GFR
BUN: 16 mg/dL (ref 6–23)
CO2: 27 meq/L (ref 19–32)
Calcium: 9.4 mg/dL (ref 8.4–10.5)
Chloride: 101 meq/L (ref 96–112)
Creatinine, Ser: 0.87 mg/dL (ref 0.40–1.20)
GFR: 77.55 mL/min (ref >60.00)
Glucose, Bld: 95 mg/dL (ref 70–99)
Potassium: 4.4 meq/L (ref 3.5–5.1)
Sodium: 138 meq/L (ref 135–145)

## 2023-10-12 LAB — TSH: TSH: 1.03 u[IU]/mL (ref 0.35–5.50)

## 2023-10-12 LAB — CBC WITH DIFFERENTIAL/PLATELET
Basophils Absolute: 0 K/uL (ref 0.0–0.1)
Basophils Relative: 0.5 % (ref 0.0–3.0)
Eosinophils Absolute: 0.2 K/uL (ref 0.0–0.7)
Eosinophils Relative: 3.4 % (ref 0.0–5.0)
HCT: 39.2 % (ref 36.0–46.0)
Hemoglobin: 12.4 g/dL (ref 12.0–15.0)
Lymphocytes Relative: 29.1 % (ref 12.0–46.0)
Lymphs Abs: 1.7 K/uL (ref 0.7–4.0)
MCHC: 31.7 g/dL (ref 30.0–36.0)
MCV: 78.6 fl (ref 78.0–100.0)
Monocytes Absolute: 0.6 K/uL (ref 0.1–1.0)
Monocytes Relative: 9.9 % (ref 3.0–12.0)
Neutro Abs: 3.3 K/uL (ref 1.4–7.7)
Neutrophils Relative %: 57.1 % (ref 43.0–77.0)
Platelets: 341 K/uL (ref 150.0–400.0)
RBC: 4.99 Mil/uL (ref 3.87–5.11)
RDW: 16.7 % — ABNORMAL HIGH (ref 11.5–15.5)
WBC: 5.7 K/uL (ref 4.0–10.5)

## 2023-10-12 LAB — LIPID PANEL
Cholesterol: 151 mg/dL (ref 0–200)
HDL: 62.4 mg/dL (ref >39.00)
LDL Cholesterol: 67 mg/dL (ref 0–99)
NonHDL: 88.87
Total CHOL/HDL Ratio: 2
Triglycerides: 110 mg/dL (ref 0.0–149.0)
VLDL: 22 mg/dL (ref 0.0–40.0)

## 2023-10-12 LAB — HEPATIC FUNCTION PANEL
ALT: 15 U/L (ref 0–35)
AST: 15 U/L (ref 0–37)
Albumin: 4.2 g/dL (ref 3.5–5.2)
Alkaline Phosphatase: 134 U/L — ABNORMAL HIGH (ref 39–117)
Bilirubin, Direct: 0.1 mg/dL (ref 0.0–0.3)
Total Bilirubin: 0.5 mg/dL (ref 0.2–1.2)
Total Protein: 7.4 g/dL (ref 6.0–8.3)

## 2023-10-12 LAB — MICROALBUMIN / CREATININE URINE RATIO
Creatinine,U: 106.8 mg/dL
Microalb Creat Ratio: UNDETERMINED mg/g (ref 0.0–30.0)
Microalb, Ur: <0.7 mg/dL

## 2023-10-12 LAB — HEMOGLOBIN A1C: Hgb A1c MFr Bld: 6.3 % (ref 4.6–6.5)

## 2023-10-12 MED ORDER — SUMATRIPTAN SUCCINATE 50 MG PO TABS: 50.0000 mg | ORAL_TABLET | ORAL | 3 refills | Status: DC | PRN

## 2023-10-12 MED ORDER — ALBUTEROL SULFATE HFA 108 (90 BASE) MCG/ACT IN AERS: 2.0000 | INHALATION_SPRAY | Freq: Four times a day (QID) | RESPIRATORY_TRACT | 1 refills | Status: AC | PRN

## 2023-10-12 MED ORDER — OZEMPIC (1 MG/DOSE) 4 MG/3ML ~~LOC~~ SOPN: 4.0000 mg | PEN_INJECTOR | SUBCUTANEOUS | 3 refills | Status: DC

## 2023-10-12 MED ORDER — METOPROLOL SUCCINATE ER 25 MG PO TB24: 25.0000 mg | ORAL_TABLET | Freq: Every day | ORAL | 3 refills | Status: AC

## 2023-10-12 MED ORDER — FLUOXETINE HCL 10 MG PO CAPS: 10.0000 mg | ORAL_CAPSULE | Freq: Every day | ORAL | 3 refills | Status: DC

## 2023-10-12 NOTE — Assessment & Plan Note (Signed)
 Deteriorated.  Pt has gained 7 lbs since last visit and BMI now 39.64.  This- combined w/ HTN and DM- qualifies as morbidly obese.  Encouraged low carb diet and regular exercise.  Will continue to follow.

## 2023-10-12 NOTE — Progress Notes (Signed)
   Subjective:    Patient ID: Carol Houston, female    DOB: [birth year], 53 y.o.   MRN: [id number]  HPI DM- ongoing issue.  On Metformin  500mg  BID and Ozempic  1mg  weekly.  UTD on eye exam.  Due for foot exam and microalbumin.  No abd pain, N/V.  No numbness/tingling of hands/feet.  HTN- chronic problem.  On Losartan  50mg  daily w/ good control.  Denies CP, SOB, visual changes, edema.  Morbid Obesity- deteriorated.  Pt has gained 7 lbs since last visit.  BMI now 39.64  Perimenopause- last full period was May but continues to have cyclical cramping and PMS sxs.  Finds herself increasingly irritable, increased migraines.     Review of Systems For ROS see HPI     Objective:   Physical Exam Vitals reviewed.  Constitutional:      General: She is not in acute distress.    Appearance: Normal appearance. She is well-developed. She is obese. She is not ill-appearing.  HENT:     Head: Normocephalic and atraumatic.  Eyes:     Conjunctiva/sclera: Conjunctivae normal.     Pupils: Pupils are equal, round, and reactive to light.  Neck:     Thyroid : No thyromegaly.  Cardiovascular:     Rate and Rhythm: Normal rate and regular rhythm.     Pulses: Normal pulses.     Heart sounds: Normal heart sounds. No murmur heard. Pulmonary:     Effort: Pulmonary effort is normal. No respiratory distress.     Breath sounds: Normal breath sounds.  Abdominal:     General: There is no distension.     Palpations: Abdomen is soft.     Tenderness: There is no abdominal tenderness.  Musculoskeletal:     Cervical back: Normal range of motion and neck supple.     Right lower leg: No edema.     Left lower leg: No edema.  Lymphadenopathy:     Cervical: No cervical adenopathy.  Skin:    General: Skin is warm and dry.  Neurological:     General: No focal deficit present.     Mental Status: She is alert and oriented to person, place, and time.  Psychiatric:        Mood and Affect: Mood normal.        Behavior:  Behavior normal.        Thought Content: Thought content normal.           Assessment & Plan:

## 2023-10-12 NOTE — Patient Instructions (Signed)
 Schedule your complete physical in 6 months We'll notify you of your lab results and make any changes if needed START the Fluoxetine  once daily to help w/ mood Continue to work on healthy diet and regular exercise- you can do it! Call with any questions or concerns Stay Safe!  Stay Healthy! Hang in there!!!

## 2023-10-12 NOTE — Assessment & Plan Note (Signed)
 Chronic problem.  Well controlled on Losartan .  Currently asymptomatic w/ exception of hormonally triggered migraines.  Check labs due to ARB use but no anticipated med changes.

## 2023-10-12 NOTE — Assessment & Plan Note (Signed)
 New.  Pt is having irregular cycles, increased irritability, and worsening migraines.  She is not interested in OCPs as they have previously made her feel badly in the past.  She is willing to start low dose SSRI to help w/ irritability.  Will start Fluoxetine  10mg  daily and monitor for improvement.  Pt expressed understanding and is in agreement w/ plan.

## 2023-10-12 NOTE — Assessment & Plan Note (Signed)
 Chronic problem.  On Metformin  500mg  BID and Ozempic  1mg  weekly.  UTD on eye exam.  Foot exam done today.  Microalbumin ordered.  Check labs.  Adjust meds prn

## 2023-10-12 NOTE — Progress Notes (Signed)
 Pt has been notified.

## 2023-11-08 ENCOUNTER — Other Ambulatory Visit: Payer: Self-pay | Admitting: Family Medicine

## 2024-01-11 ENCOUNTER — Other Ambulatory Visit: Payer: Self-pay | Admitting: Family Medicine

## 2024-01-11 DIAGNOSIS — I10 Essential (primary) hypertension: Secondary | ICD-10-CM

## 2024-02-08 ENCOUNTER — Other Ambulatory Visit: Payer: Self-pay | Admitting: Family Medicine

## 2024-02-10 ENCOUNTER — Other Ambulatory Visit: Payer: Self-pay | Admitting: Family Medicine

## 2024-03-05 ENCOUNTER — Encounter: Admitting: Family Medicine

## 2024-05-03 ENCOUNTER — Encounter: Admitting: Family Medicine
